# Patient Record
Sex: Male | Born: 1970 | Race: White | Hispanic: No | Smoking: Current every day smoker
Health system: Southern US, Community
[De-identification: ages and names within clinical notes are randomized; demographics above are authoritative.]

## PROBLEM LIST (undated history)

## (undated) DIAGNOSIS — F419 Anxiety disorder, unspecified: Secondary | ICD-10-CM

## (undated) DIAGNOSIS — F329 Major depressive disorder, single episode, unspecified: Secondary | ICD-10-CM

## (undated) DIAGNOSIS — K219 Gastro-esophageal reflux disease without esophagitis: Secondary | ICD-10-CM

## (undated) DIAGNOSIS — J449 Chronic obstructive pulmonary disease, unspecified: Secondary | ICD-10-CM

## (undated) DIAGNOSIS — M069 Rheumatoid arthritis, unspecified: Secondary | ICD-10-CM

## (undated) DIAGNOSIS — F32A Depression, unspecified: Secondary | ICD-10-CM

## (undated) DIAGNOSIS — M359 Systemic involvement of connective tissue, unspecified: Secondary | ICD-10-CM

## (undated) HISTORY — DX: Chronic obstructive pulmonary disease, unspecified: J44.9

## (undated) HISTORY — DX: Depression, unspecified: F32.A

## (undated) HISTORY — PX: BACK SURGERY: SHX140

## (undated) HISTORY — DX: Anxiety disorder, unspecified: F41.9

## (undated) HISTORY — DX: Gastro-esophageal reflux disease without esophagitis: K21.9

---
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
## 2002-09-04 ENCOUNTER — Ambulatory Visit (HOSPITAL_COMMUNITY): Admission: RE | Admit: 2002-09-04 | Discharge: 2002-09-04 | Payer: Self-pay | Admitting: Pulmonary Disease

## 2002-09-29 ENCOUNTER — Encounter: Payer: Self-pay | Admitting: Orthopedic Surgery

## 2002-10-27 ENCOUNTER — Encounter: Payer: Self-pay | Admitting: Orthopedic Surgery

## 2002-10-27 ENCOUNTER — Ambulatory Visit (HOSPITAL_COMMUNITY): Admission: RE | Admit: 2002-10-27 | Discharge: 2002-10-27 | Payer: Self-pay | Admitting: Orthopedic Surgery

## 2004-06-04 ENCOUNTER — Emergency Department (HOSPITAL_COMMUNITY): Admission: EM | Admit: 2004-06-04 | Discharge: 2004-06-04 | Payer: Self-pay | Admitting: Emergency Medicine

## 2004-11-27 ENCOUNTER — Ambulatory Visit: Payer: Self-pay | Admitting: Orthopedic Surgery

## 2004-12-08 ENCOUNTER — Emergency Department (HOSPITAL_COMMUNITY): Admission: EM | Admit: 2004-12-08 | Discharge: 2004-12-08 | Payer: Self-pay | Admitting: *Deleted

## 2004-12-22 ENCOUNTER — Ambulatory Visit (HOSPITAL_COMMUNITY): Admission: RE | Admit: 2004-12-22 | Discharge: 2004-12-22 | Payer: Self-pay | Admitting: Orthopedic Surgery

## 2004-12-28 ENCOUNTER — Ambulatory Visit: Payer: Self-pay | Admitting: Orthopedic Surgery

## 2005-01-12 ENCOUNTER — Ambulatory Visit (HOSPITAL_COMMUNITY): Admission: RE | Admit: 2005-01-12 | Discharge: 2005-01-12 | Payer: Self-pay | Admitting: Urology

## 2008-02-25 ENCOUNTER — Encounter (HOSPITAL_COMMUNITY): Admission: RE | Admit: 2008-02-25 | Discharge: 2008-03-26 | Payer: Self-pay | Admitting: Neurosurgery

## 2008-11-04 ENCOUNTER — Emergency Department (HOSPITAL_COMMUNITY): Admission: EM | Admit: 2008-11-04 | Discharge: 2008-11-04 | Payer: Self-pay | Admitting: Emergency Medicine

## 2009-02-12 ENCOUNTER — Emergency Department (HOSPITAL_COMMUNITY): Admission: EM | Admit: 2009-02-12 | Discharge: 2009-02-12 | Payer: Self-pay | Admitting: Emergency Medicine

## 2009-04-26 ENCOUNTER — Emergency Department (HOSPITAL_COMMUNITY): Admission: EM | Admit: 2009-04-26 | Discharge: 2009-04-26 | Payer: Self-pay | Admitting: Emergency Medicine

## 2009-10-22 ENCOUNTER — Emergency Department (HOSPITAL_COMMUNITY): Admission: EM | Admit: 2009-10-22 | Discharge: 2009-10-22 | Payer: Self-pay | Admitting: Emergency Medicine

## 2009-12-20 ENCOUNTER — Encounter: Payer: Self-pay | Admitting: Orthopedic Surgery

## 2009-12-20 ENCOUNTER — Ambulatory Visit (HOSPITAL_COMMUNITY)
Admission: RE | Admit: 2009-12-20 | Discharge: 2009-12-20 | Payer: Self-pay | Source: Home / Self Care | Admitting: Pulmonary Disease

## 2010-01-24 ENCOUNTER — Encounter: Payer: Self-pay | Admitting: Orthopedic Surgery

## 2010-01-24 ENCOUNTER — Ambulatory Visit
Admission: RE | Admit: 2010-01-24 | Discharge: 2010-01-24 | Payer: Self-pay | Source: Home / Self Care | Attending: Orthopedic Surgery | Admitting: Orthopedic Surgery

## 2010-01-24 DIAGNOSIS — M7520 Bicipital tendinitis, unspecified shoulder: Secondary | ICD-10-CM | POA: Insufficient documentation

## 2010-01-25 ENCOUNTER — Encounter: Payer: Self-pay | Admitting: Orthopedic Surgery

## 2010-02-16 NOTE — Progress Notes (Signed)
Summary: Gary Woods   Imported By: Jacklynn Ganong 01/23/2010 09:04:55  _____________________________________________________________________  External Attachment:    Type:   Image     Comment:   External Document

## 2010-02-16 NOTE — Letter (Signed)
Summary: Referral note from Dr. Juanetta Gosling  Referral note from Dr. Juanetta Gosling   Imported By: Jacklynn Ganong 01/26/2010 08:45:07  _____________________________________________________________________  External Attachment:    Type:   Image     Comment:   External Document
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
## 2010-02-16 NOTE — Progress Notes (Signed)
Summary: Initial evaluation  Initial evaluation   Imported By: Jacklynn Ganong 01/23/2010 09:04:05  _____________________________________________________________________  External Attachment:    Type:   Image     Comment:   External Document
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
## 2010-02-16 NOTE — Letter (Signed)
Summary: History form  History form   Imported By: Jacklynn Ganong 01/26/2010 08:43:02  _____________________________________________________________________  External Attachment:    Type:   Image     Comment:   External Document

## 2010-04-05 LAB — CBC
RBC: 4.93 MIL/uL (ref 4.22–5.81)
WBC: 9.4 10*3/uL (ref 4.0–10.5)

## 2010-04-05 LAB — BASIC METABOLIC PANEL
Calcium: 9.3 mg/dL (ref 8.4–10.5)
Chloride: 107 mEq/L (ref 96–112)
Creatinine, Ser: 0.95 mg/dL (ref 0.4–1.5)

## 2010-04-05 LAB — POCT CARDIAC MARKERS
CKMB, poc: <1 ng/mL — ABNORMAL LOW (ref 1.0–8.0)
Myoglobin, poc: 56.6 ng/mL (ref 12–200)
Myoglobin, poc: 56.9 ng/mL (ref 12–200)
Troponin i, poc: <0.05 ng/mL (ref 0.00–0.09)
Troponin i, poc: <0.05 ng/mL (ref 0.00–0.09)

## 2010-04-05 LAB — DIFFERENTIAL
Basophils Absolute: 0.1 10*3/uL (ref 0.0–0.1)
Basophils Relative: 1 % (ref 0–1)
Eosinophils Relative: 1 % (ref 0–5)
Lymphocytes Relative: 31 % (ref 12–46)
Lymphs Abs: 3 10*3/uL (ref 0.7–4.0)
Monocytes Relative: 6 % (ref 3–12)
Neutro Abs: 5.8 10*3/uL (ref 1.7–7.7)
Neutrophils Relative %: 61 % (ref 43–77)

## 2010-11-02 ENCOUNTER — Ambulatory Visit (INDEPENDENT_AMBULATORY_CARE_PROVIDER_SITE_OTHER): Payer: Medicaid Other | Admitting: Otolaryngology

## 2010-11-02 DIAGNOSIS — H905 Unspecified sensorineural hearing loss: Secondary | ICD-10-CM

## 2011-04-09 IMAGING — CR DG HIP (WITH OR WITHOUT PELVIS) 2-3V*L*
3 series · 3 of 3 positions shown · non-contrast
Comparison: None.

CLINICAL DATA: 39-year-old male with hip pain.  No known injury.

LEFT HIP - COMPLETE 2+ VIEW

[view not recorded (1 of 3)]
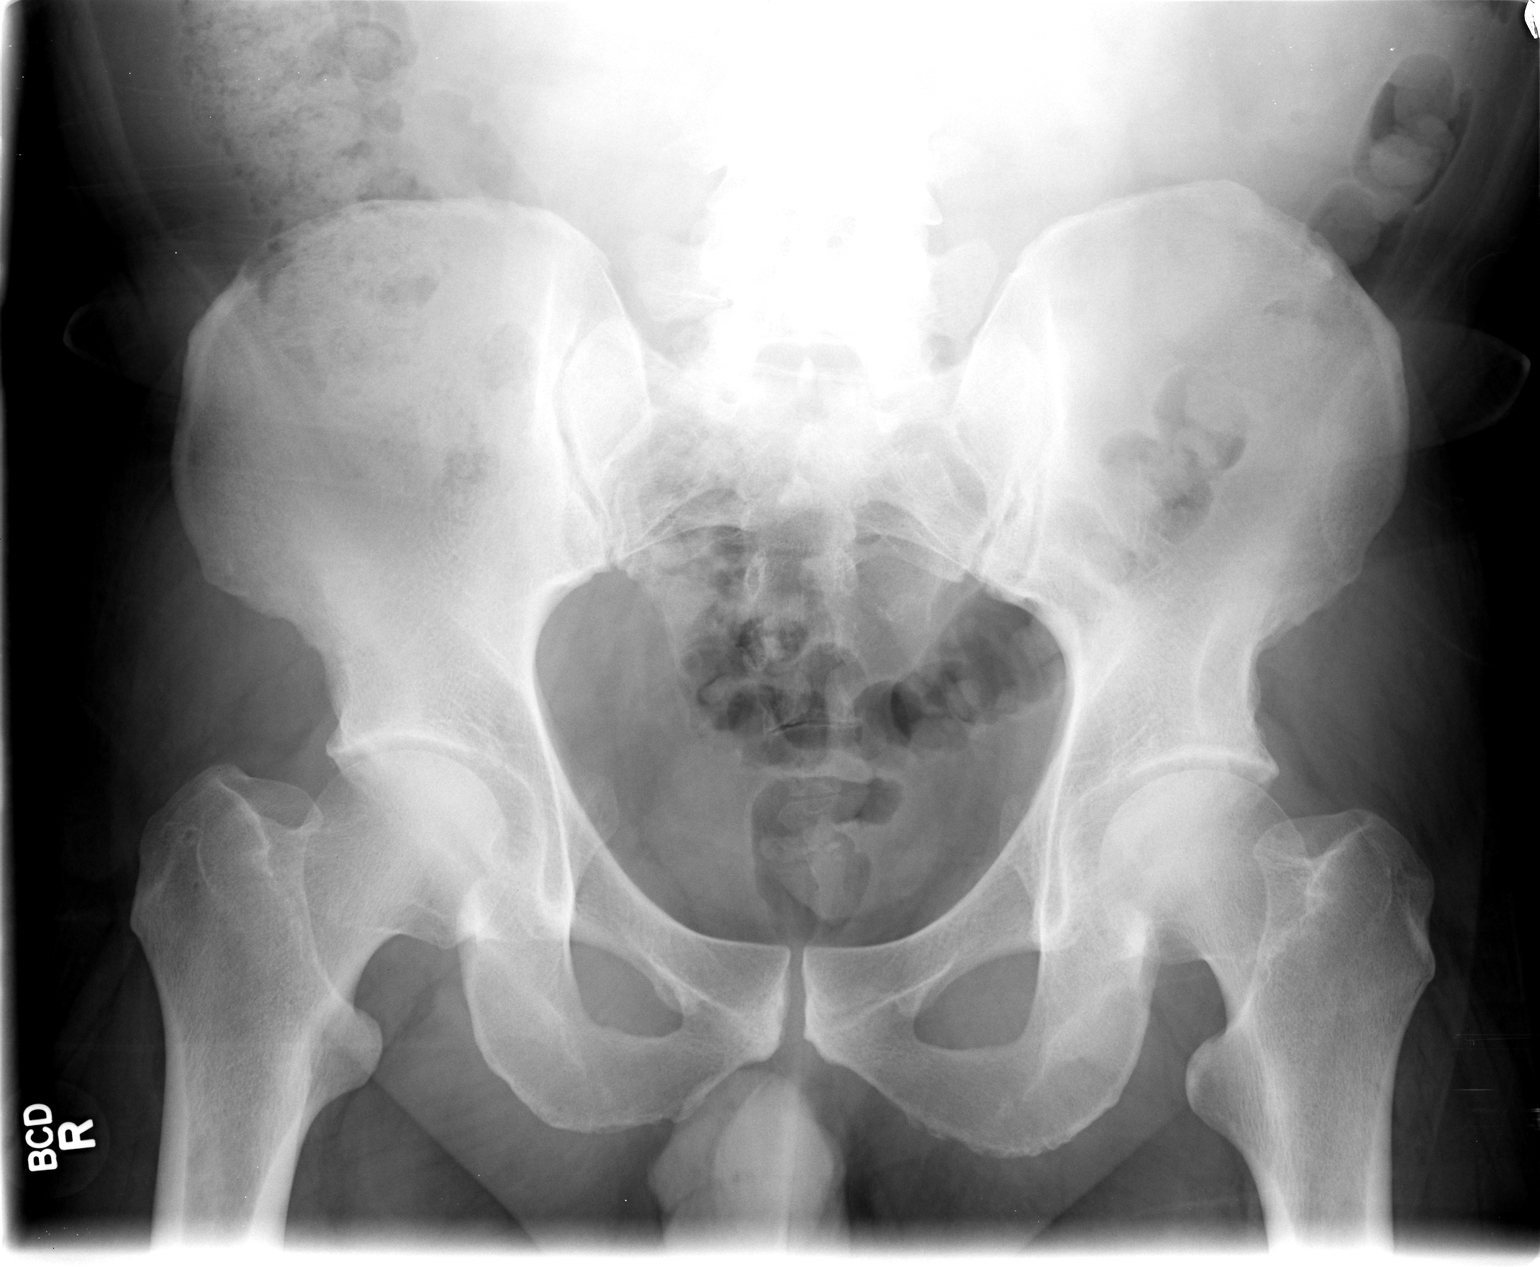

[view not recorded (2 of 3)]
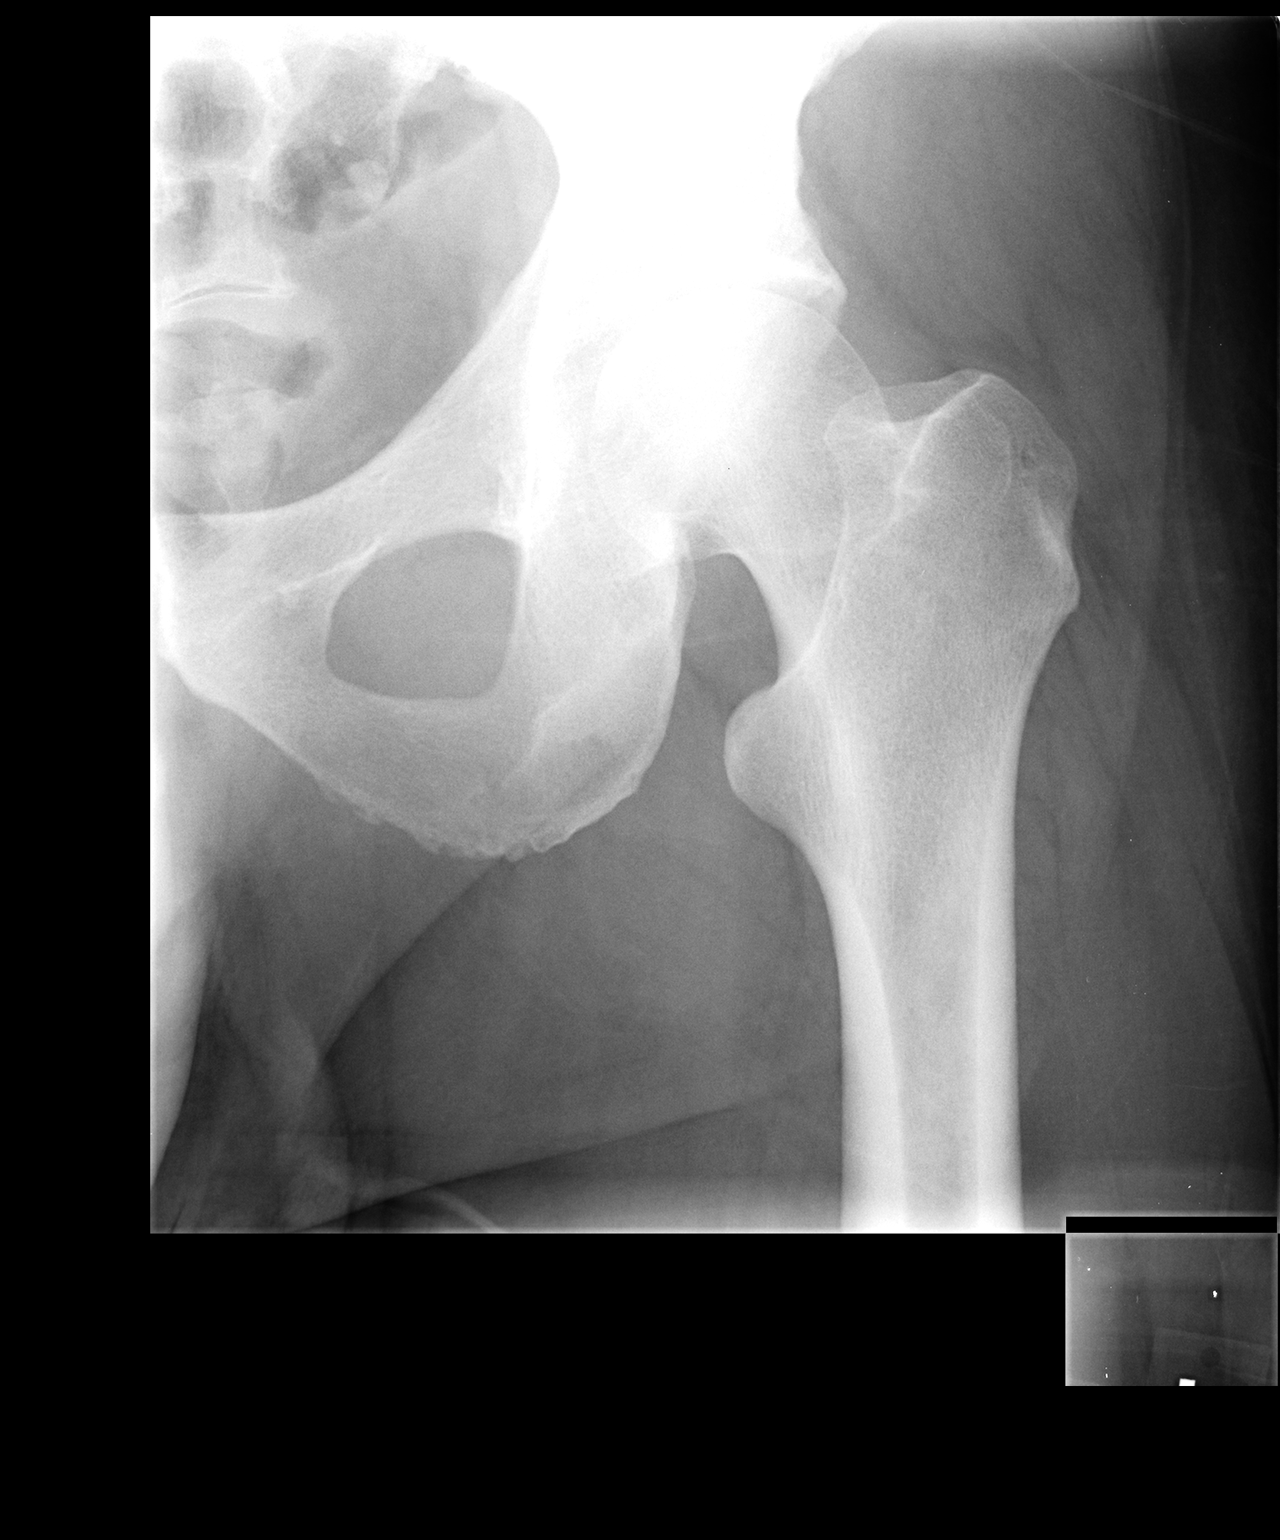

[view not recorded (3 of 3)]
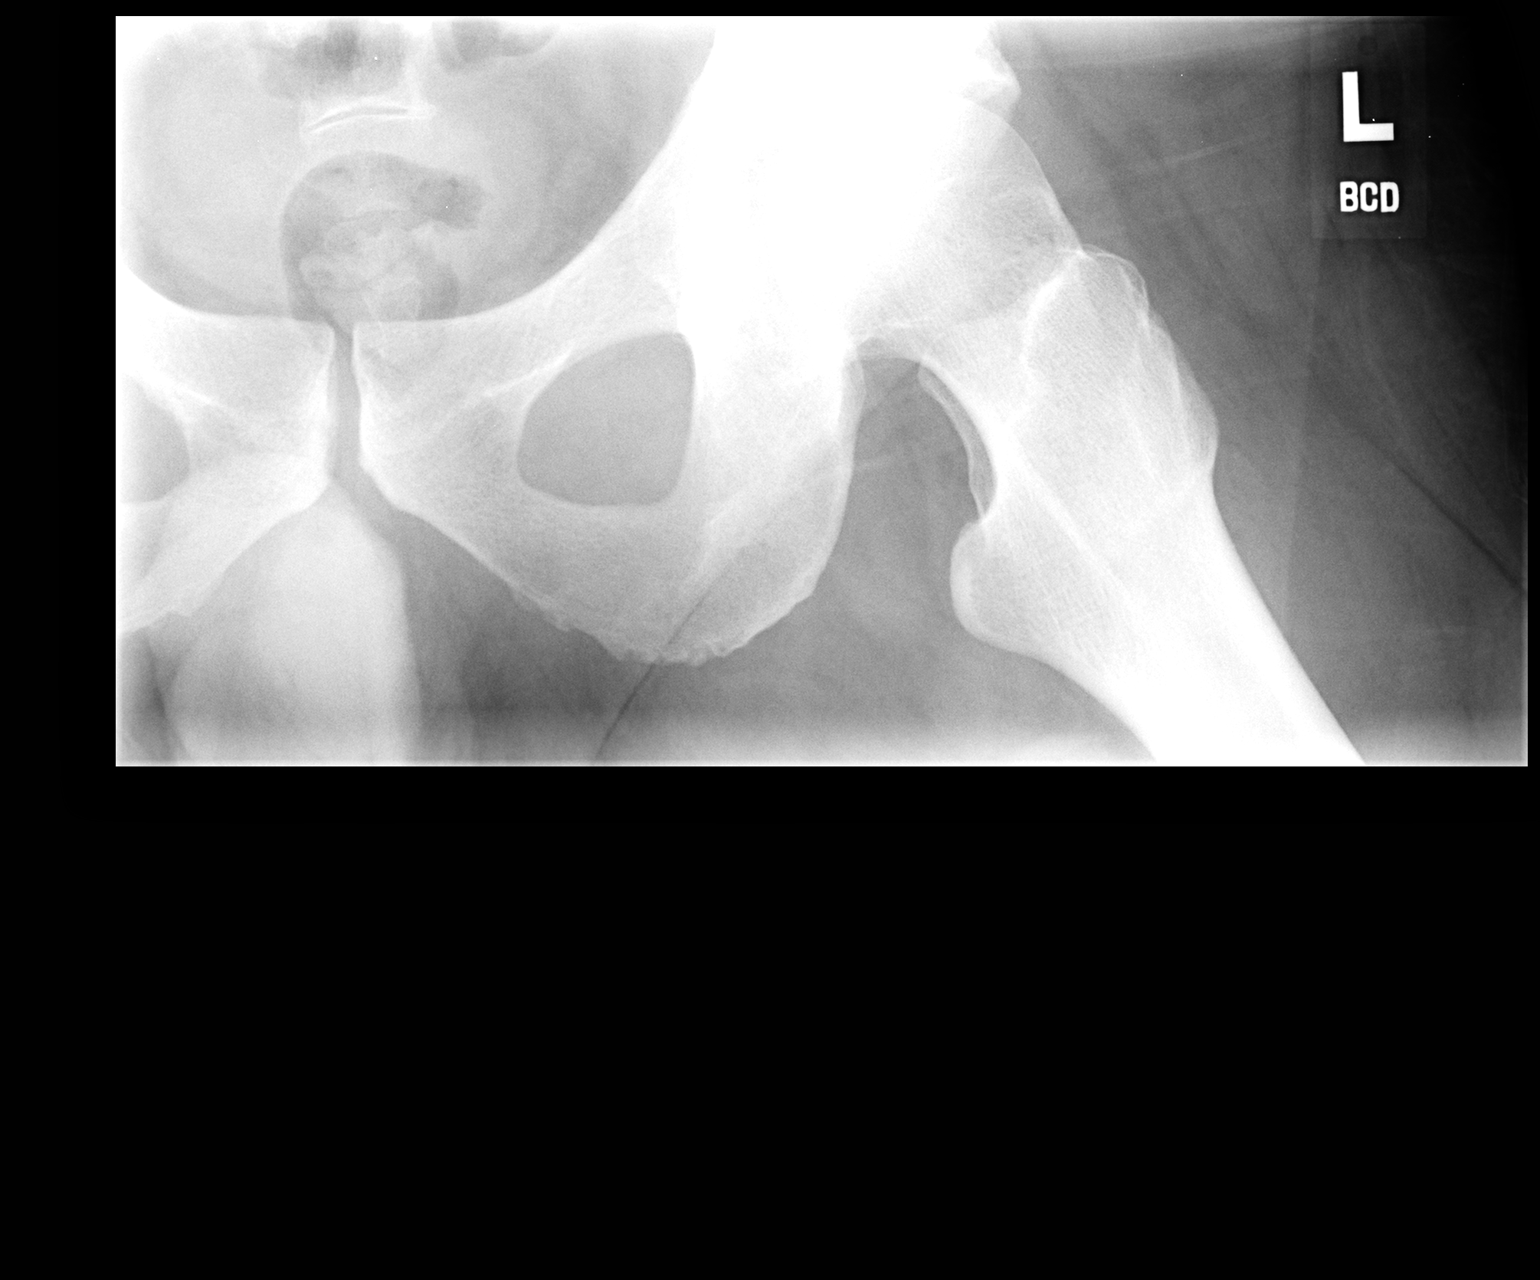

[3 of 3 positions shown; findings below may reference images not displayed]

FINDINGS: Bone mineralization is within normal limits.  Left
femoral head normally located.  Normal hip joint space.  Visualized
proximal femur intact.  Sacrum and SI joints within normal limits.
Pelvis intact.  Unremarkable visualized lower spine.
IMPRESSION: No acute osseous abnormality identified about the left hip and
pelvis.

## 2011-04-09 IMAGING — CR DG CERVICAL SPINE COMPLETE 4+V
5 series · 5 of 5 positions shown · non-contrast
Comparison: None.

CLINICAL DATA: 39-year-old male with neck and shoulder pain.  No
known injury.

CERVICAL SPINE - COMPLETE 4+ VIEW

[view not recorded (1 of 5)]
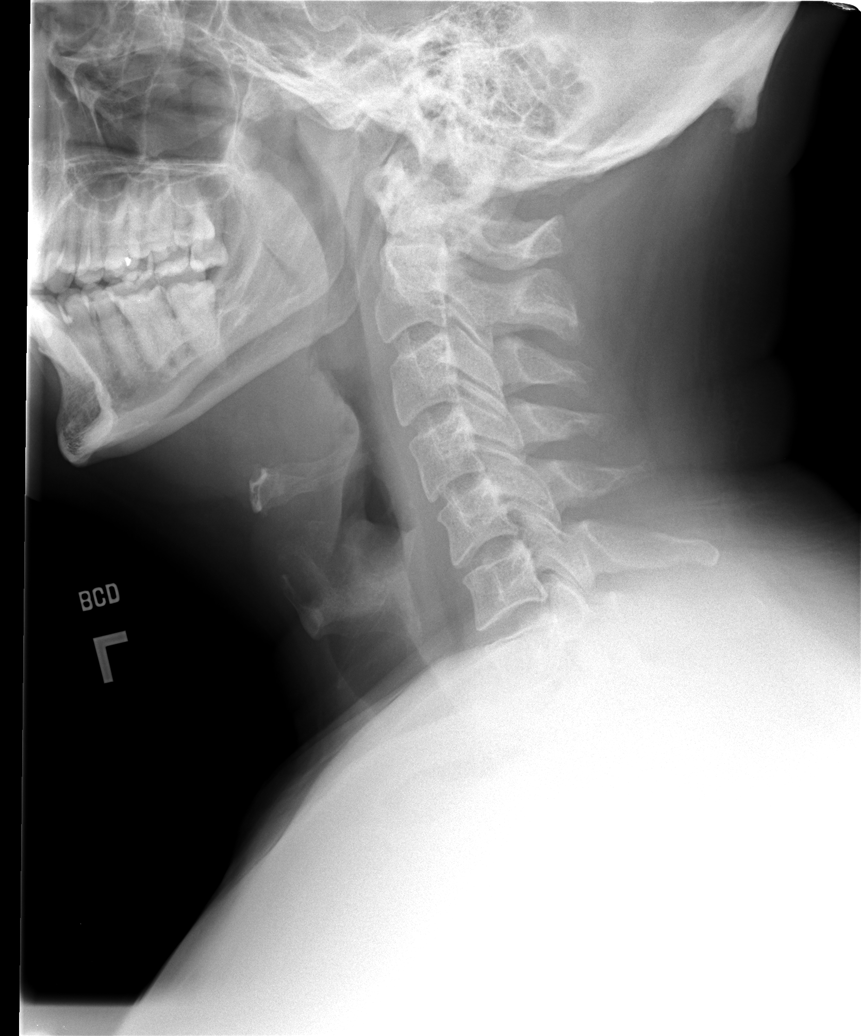

[view not recorded (2 of 5)]
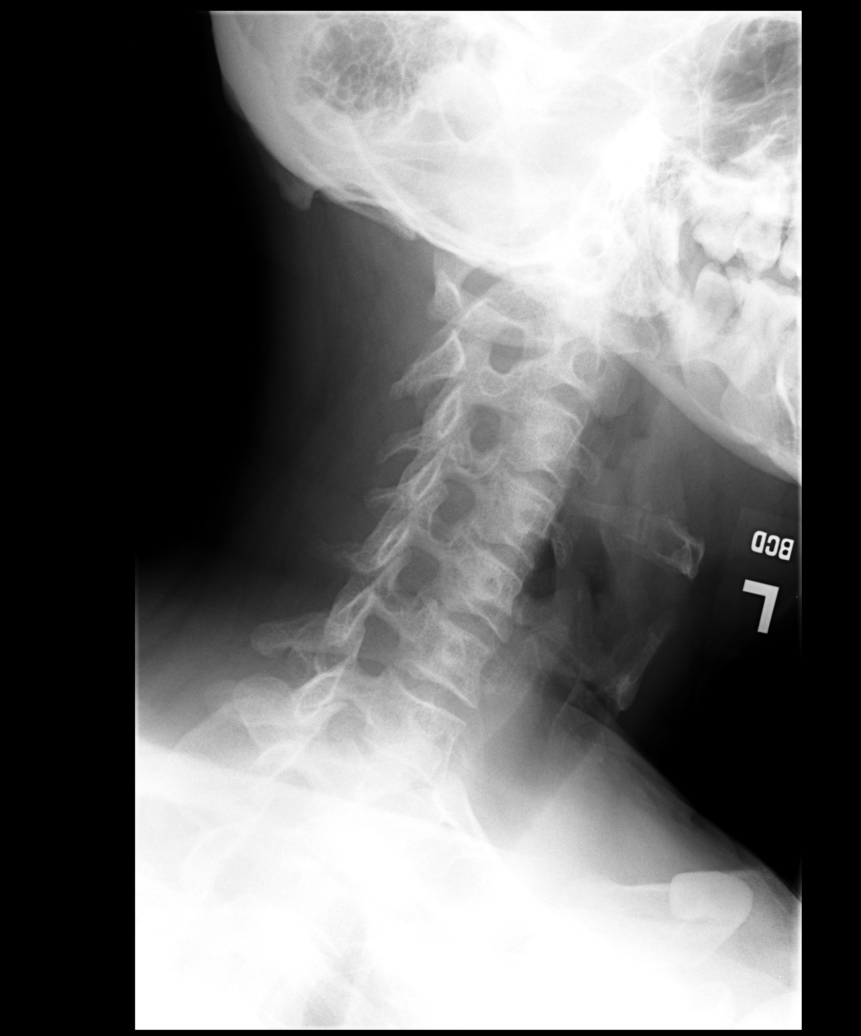

[view not recorded (3 of 5)]
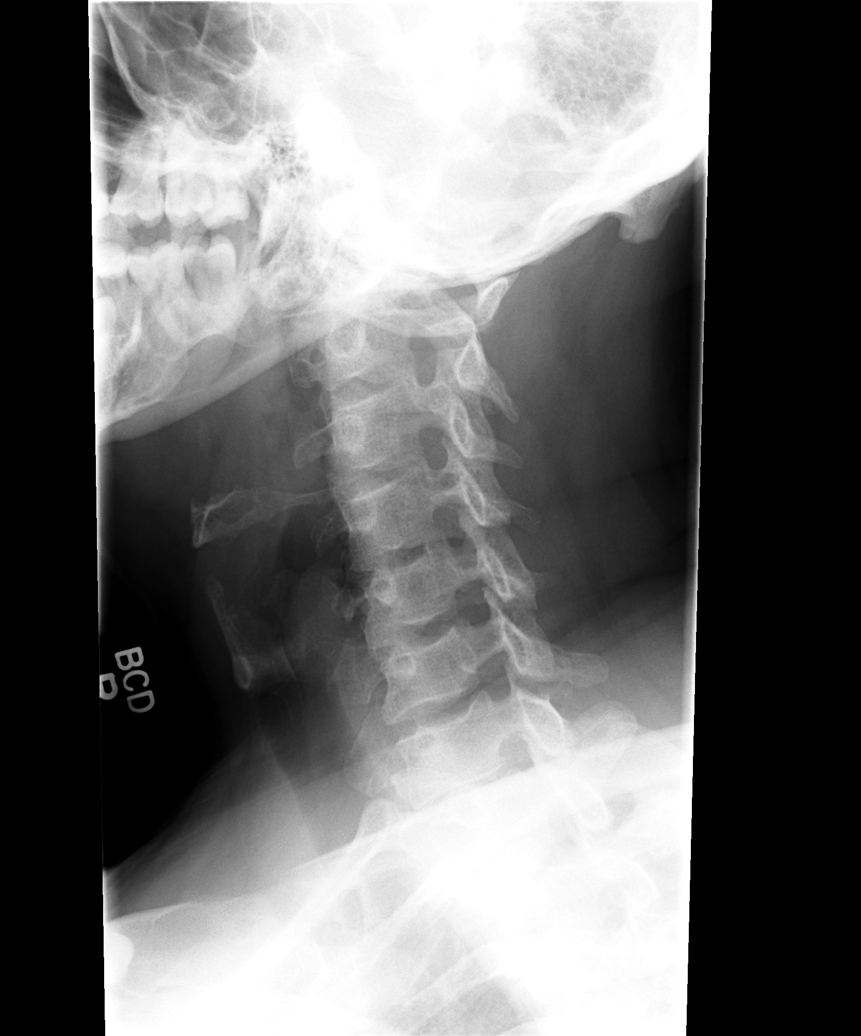

[view not recorded (4 of 5)]
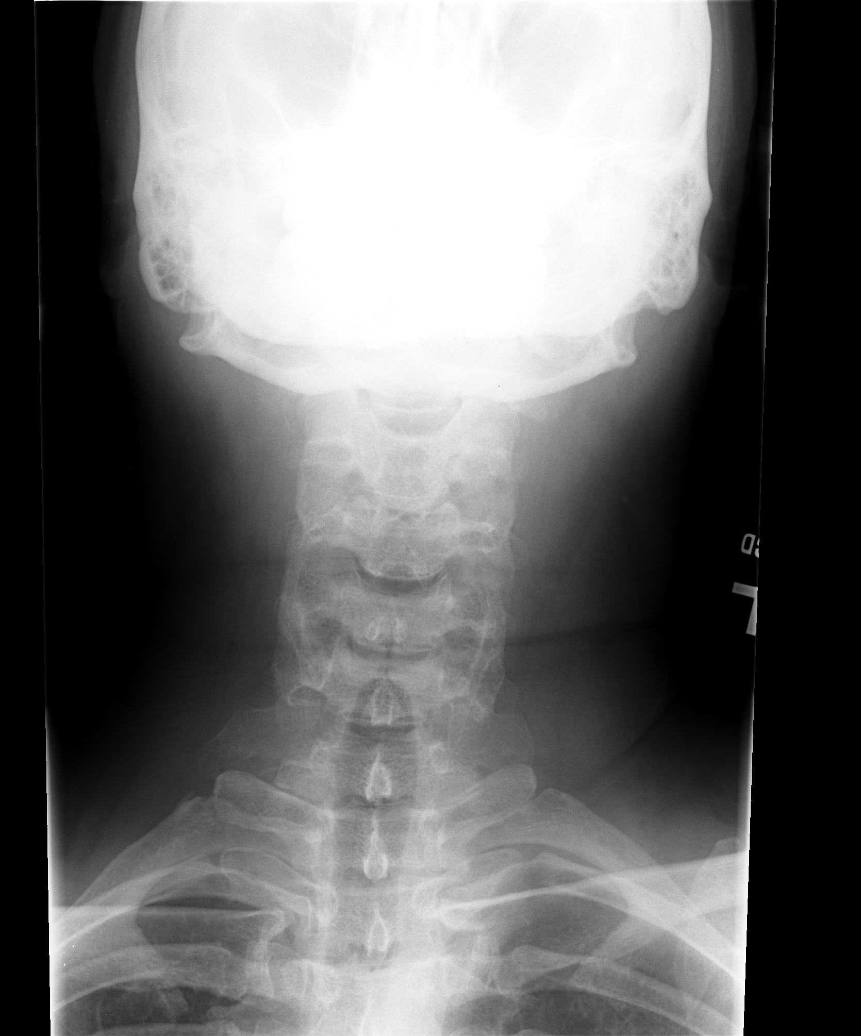

[view not recorded (5 of 5)]
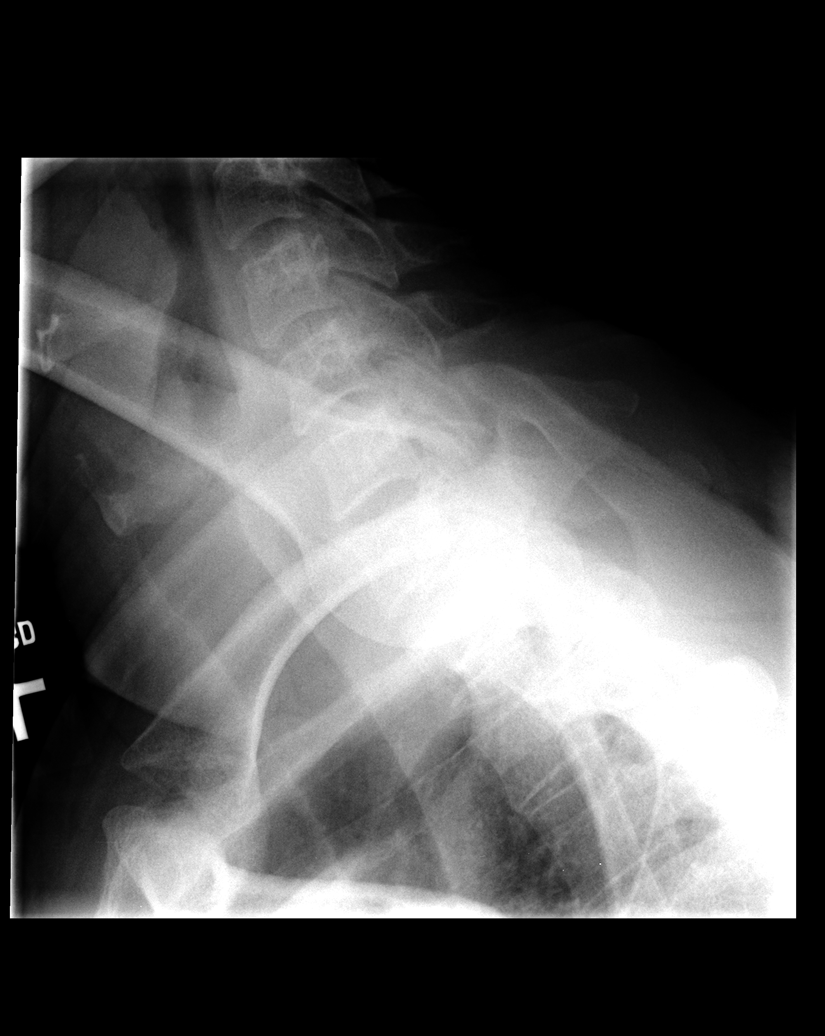

[5 of 5 positions shown; findings below may reference images not displayed]

FINDINGS: Straightening of cervical lordosis.  Prevertebral soft
tissues are within normal limits.  The relatively preserved disc
spaces. Bilateral posterior element alignment is within normal
limits.  AP alignment and lung apices within normal limits.  C1-C2
alignment and odontoid within normal limits. Cervicothoracic
junction alignment is within normal limits.
IMPRESSION: No acute osseous abnormality in the cervical spine.

## 2011-06-21 ENCOUNTER — Ambulatory Visit (INDEPENDENT_AMBULATORY_CARE_PROVIDER_SITE_OTHER): Payer: Medicaid Other | Admitting: Otolaryngology

## 2011-06-21 DIAGNOSIS — H6980 Other specified disorders of Eustachian tube, unspecified ear: Secondary | ICD-10-CM

## 2011-06-21 DIAGNOSIS — H905 Unspecified sensorineural hearing loss: Secondary | ICD-10-CM

## 2011-10-04 ENCOUNTER — Ambulatory Visit (INDEPENDENT_AMBULATORY_CARE_PROVIDER_SITE_OTHER): Payer: Medicaid Other | Admitting: Otolaryngology

## 2011-10-04 DIAGNOSIS — H6990 Unspecified Eustachian tube disorder, unspecified ear: Secondary | ICD-10-CM

## 2011-10-04 DIAGNOSIS — H6980 Other specified disorders of Eustachian tube, unspecified ear: Secondary | ICD-10-CM

## 2011-10-04 DIAGNOSIS — J310 Chronic rhinitis: Secondary | ICD-10-CM

## 2012-05-30 ENCOUNTER — Emergency Department (HOSPITAL_COMMUNITY): Payer: Medicaid Other

## 2012-05-30 ENCOUNTER — Encounter (HOSPITAL_COMMUNITY): Payer: Self-pay | Admitting: Emergency Medicine

## 2012-05-30 ENCOUNTER — Emergency Department (HOSPITAL_COMMUNITY)
Admission: EM | Admit: 2012-05-30 | Discharge: 2012-05-30 | Disposition: A | Discharge status: Home / Self Care | Payer: Medicaid Other | Attending: Emergency Medicine | Admitting: Emergency Medicine

## 2012-05-30 DIAGNOSIS — R42 Dizziness and giddiness: Secondary | ICD-10-CM | POA: Insufficient documentation

## 2012-05-30 DIAGNOSIS — Z79899 Other long term (current) drug therapy: Secondary | ICD-10-CM | POA: Insufficient documentation

## 2012-05-30 DIAGNOSIS — R109 Unspecified abdominal pain: Secondary | ICD-10-CM | POA: Insufficient documentation

## 2012-05-30 DIAGNOSIS — J40 Bronchitis, not specified as acute or chronic: Secondary | ICD-10-CM

## 2012-05-30 DIAGNOSIS — J3489 Other specified disorders of nose and nasal sinuses: Secondary | ICD-10-CM | POA: Insufficient documentation

## 2012-05-30 DIAGNOSIS — Z8739 Personal history of other diseases of the musculoskeletal system and connective tissue: Secondary | ICD-10-CM | POA: Insufficient documentation

## 2012-05-30 DIAGNOSIS — R0602 Shortness of breath: Secondary | ICD-10-CM | POA: Insufficient documentation

## 2012-05-30 DIAGNOSIS — J209 Acute bronchitis, unspecified: Secondary | ICD-10-CM | POA: Insufficient documentation

## 2012-05-30 DIAGNOSIS — F17200 Nicotine dependence, unspecified, uncomplicated: Secondary | ICD-10-CM | POA: Insufficient documentation

## 2012-05-30 LAB — CBC WITH DIFFERENTIAL/PLATELET
Basophils Absolute: 0 10*3/uL (ref 0.0–0.1)
Basophils Relative: 0 % (ref 0–1)
Eosinophils Absolute: 0 10*3/uL (ref 0.0–0.7)
HCT: 47.3 % (ref 39.0–52.0)
Lymphocytes Relative: 41 % (ref 12–46)
Lymphs Abs: 2.2 10*3/uL (ref 0.7–4.0)
Monocytes Absolute: 0.6 10*3/uL (ref 0.1–1.0)
Monocytes Relative: 10 % (ref 3–12)
Neutro Abs: 2.6 10*3/uL (ref 1.7–7.7)
RBC: 4.97 MIL/uL (ref 4.22–5.81)
RDW: 13 % (ref 11.5–15.5)

## 2012-05-30 LAB — BASIC METABOLIC PANEL
Calcium: 9.1 mg/dL (ref 8.4–10.5)
GFR calc Af Amer: >90 mL/min (ref >90)
Glucose, Bld: 89 mg/dL (ref 70–99)

## 2012-05-30 LAB — TROPONIN I: Troponin I: <0.3 ng/mL (ref <0.30)

## 2012-05-30 MED ORDER — LEVOFLOXACIN 750 MG PO TABS: 750.0000 mg | ORAL_TABLET | Freq: Every day | ORAL | Status: DC

## 2012-05-30 MED ORDER — AEROCHAMBER PLUS W/MASK MISC: Status: DC

## 2012-05-30 MED ORDER — ALBUTEROL SULFATE HFA 108 (90 BASE) MCG/ACT IN AERS: 2.0000 | INHALATION_SPRAY | RESPIRATORY_TRACT | Status: DC | PRN

## 2012-05-30 NOTE — ED Notes (Signed)
Patient with no complaints at this time. Respirations even and unlabored. Skin warm/dry. Discharge instructions reviewed with patient at this time. Patient given opportunity to voice concerns/ask questions. Patient discharged at this time and left Emergency Department with steady gait.   

## 2012-05-30 NOTE — ED Notes (Signed)
Pt seen pcp x 4 days ago and dx with URI.has been taking rx cough/antibiotics meds.  Went to dr Juanetta Gosling today due to chest tightness and no better.  Was sent here. Pt arrived alert/oriented. Face red. cbg 97 en route. Pt c/o right sided chest pressure that does not radiate. Denies sob. C/o some dizziness. States he is having stress at home. Nondiaphoretic.

## 2012-05-30 NOTE — ED Provider Notes (Signed)
History  This chart was scribed for Flint Melter, MD by Bennett Scrape, ED Scribe. This patient was seen in room APA05/APA05 and the patient's care was started at 2:00 PM.  CSN: [id number]  Arrival date & time 05/30/12  1245   First MD Initiated Contact with Patient 05/30/12 1400      Chief Complaint  Patient presents with  . Cough     The history is provided by the patient. No language interpreter was used.    HPI Comments: Gary Woods is a 42 y.o. male who presents to the Emergency Department complaining of one week of gradual onset, gradually worsening, constant non-productive cough with associated bilateral abdominal pain with coughing only. He also reports SOB during coughing spells. He has been seen by his PCP 4 days ago for the same and was given an antibiotic (keflex) and tessalon with mild improvement. He denies having prior episodes of similar symptoms. He denies acute SOB, fever and decreased appetite as associated symptoms. He is not currently on any daily medications. Pt is a current everyday smoker but denies alcohol use.   Past Medical History  Diagnosis Date  . Arthritis     History reviewed. No pertinent past surgical history.  History reviewed. No pertinent family history.  History  Substance Use Topics  . Smoking status: Current Every Day Smoker  . Smokeless tobacco: Not on file  . Alcohol Use: No      Review of Systems  Constitutional: Negative for fever.  Respiratory: Positive for cough and shortness of breath (during coughing spells). Negative for chest tightness.   Gastrointestinal: Positive for abdominal pain (with coughing). Negative for nausea and vomiting.  All other systems reviewed and are negative.    Allergies  Review of patient's allergies indicates no known allergies.  Home Medications   Current Outpatient Rx  Name  Route  Sig  Dispense  Refill  . benzonatate (TESSALON) 100 MG capsule   Oral   Take 100 mg by mouth 3  (three) times daily as needed for cough.         . cephALEXin (KEFLEX) 500 MG capsule   Oral   Take 500 mg by mouth 3 (three) times daily.         . methotrexate (RHEUMATREX) 2.5 MG tablet   Oral   Take 2.5 mg by mouth once a week. Caution:Chemotherapy. Protect from light. **normally takes on Monday or Tuesday**         . albuterol (PROVENTIL HFA;VENTOLIN HFA) 108 (90 BASE) MCG/ACT inhaler   Inhalation   Inhale 2 puffs into the lungs every 4 (four) hours as needed for wheezing.   6.7 g   0   . levofloxacin (LEVAQUIN) 750 MG tablet   Oral   Take 1 tablet (750 mg total) by mouth daily. X 7 days   7 tablet   0   . Spacer/Aero-Holding Chambers (AEROCHAMBER PLUS WITH MASK) inhaler      Use as instructed   1 each   2     Use with the inhaler     Triage Vitals: BP 123/69  Pulse 54  Temp(Src) 98.6 F (37 C) (Oral)  Resp 20  SpO2 99%  Physical Exam  Nursing note and vitals reviewed. Constitutional: He is oriented to person, place, and time. He appears well-developed and well-nourished.  HENT:  Head: Normocephalic and atraumatic.  Right Ear: External ear normal.  Left Ear: External ear normal.  Eyes: Conjunctivae and EOM are normal.  Pupils are equal, round, and reactive to light.  Neck: Normal range of motion and phonation normal. Neck supple.  Cardiovascular: Normal rate, regular rhythm, normal heart sounds and intact distal pulses.   Pulmonary/Chest: Effort normal and breath sounds normal. He exhibits no bony tenderness.  Abdominal: Soft. Normal appearance. There is no tenderness.  Musculoskeletal: Normal range of motion.  Neurological: He is alert and oriented to person, place, and time. He has normal strength. No cranial nerve deficit or sensory deficit. He exhibits normal muscle tone. Coordination normal.  Skin: Skin is warm, dry and intact.  Psychiatric: He has a normal mood and affect. His behavior is normal. Judgment and thought content normal.    ED  Course  Procedures (including critical care time)  DIAGNOSTIC STUDIES: Oxygen Saturation is 99% on room air, normal by my interpretation.    COORDINATION OF CARE: 2:07 PM-Informed pt of negative CXR, CBC and normal EKG. Discussed treatment plan with pt at bedside and pt agreed to plan. Advised pt that he should complete the Keflex antibiotic course. Discussed smoking cessation.  2:19 PM-Informed pt of negative BMP and troponin. Discussed discharge plan which includes Levaquin and albuterol inhaler with pt and pt agreed to plan. Also advised pt to follow up as needed and pt agreed. Addressed symptoms to return for with pt.   Nursing Notes Reviewed/ Care Coordinated Applicable Imaging Reviewed Radiologic imaging report reviewed and images by radiology  - viewed, by me.  Interpretation of Laboratory Data incorporated into ED treatment  Labs Reviewed  TROPONIN I  CBC WITH DIFFERENTIAL  BASIC METABOLIC PANEL   Dg Chest Portable 1 View  05/30/2012   *RADIOLOGY REPORT*  Clinical Data: Nasal congestion, dizziness, chest pressure  CHEST - 1 VIEW  Comparison:  04/26/2009  Findings: The heart size and mediastinal contours are within normal limits.  Both lungs are clear.  IMPRESSION: No active disease.   Original Report Authenticated By: Judie Petit. Shick, M.D.     1. Bronchitis       MDM  Evaluation consistent with bronchitis. Doubt ACS, PE, or pneumonia.Doubt metabolic instability, serious bacterial infection or impending vascular collapse; the patient is stable for discharge.  Nursing Notes Reviewed/ Care Coordinated, and agree without changes. Applicable Imaging Reviewed.  Interpretation of Laboratory Data incorporated into ED treatment  Plan: Home Medications- Levaquin and albuterol inhaler; Home Treatments- use inhaler as needed every 4 hours and take Levaquin as prescribed; Recommended follow up- with PCP or return for worsening or concerning symptoms   I personally performed the services  described in this documentation, which was scribed in my presence. The recorded information has been reviewed and is accurate.      Flint Melter, MD 05/30/12 2137

## 2012-05-30 NOTE — ED Notes (Signed)
Pt denies si/hi. Pt anxious, comfort measures given. Pt states he is nervous. Per ems, pt was hyperventilating upon arrival.

## 2012-10-16 ENCOUNTER — Ambulatory Visit (INDEPENDENT_AMBULATORY_CARE_PROVIDER_SITE_OTHER): Payer: Medicaid Other | Admitting: Otolaryngology

## 2012-10-16 DIAGNOSIS — H6990 Unspecified Eustachian tube disorder, unspecified ear: Secondary | ICD-10-CM

## 2012-10-16 DIAGNOSIS — H6120 Impacted cerumen, unspecified ear: Secondary | ICD-10-CM

## 2012-10-16 DIAGNOSIS — H6980 Other specified disorders of Eustachian tube, unspecified ear: Secondary | ICD-10-CM

## 2013-01-14 ENCOUNTER — Ambulatory Visit (HOSPITAL_COMMUNITY)
Admission: RE | Admit: 2013-01-14 | Discharge: 2013-01-14 | Disposition: A | Discharge status: Home / Self Care | Payer: Medicaid Other | Source: Ambulatory Visit | Attending: Preventative Medicine | Admitting: Preventative Medicine

## 2013-01-14 DIAGNOSIS — T24239A Burn of second degree of unspecified lower leg, initial encounter: Secondary | ICD-10-CM | POA: Insufficient documentation

## 2013-01-14 DIAGNOSIS — IMO0001 Reserved for inherently not codable concepts without codable children: Secondary | ICD-10-CM | POA: Insufficient documentation

## 2013-01-14 DIAGNOSIS — T300 Burn of unspecified body region, unspecified degree: Secondary | ICD-10-CM
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
## 2013-01-16 ENCOUNTER — Ambulatory Visit (HOSPITAL_COMMUNITY)
Admission: RE | Admit: 2013-01-16 | Discharge: 2013-01-16 | Disposition: A | Discharge status: Home / Self Care | Payer: Medicaid Other | Source: Ambulatory Visit | Attending: Pulmonary Disease | Admitting: Pulmonary Disease

## 2013-01-16 DIAGNOSIS — T24239A Burn of second degree of unspecified lower leg, initial encounter: Secondary | ICD-10-CM | POA: Insufficient documentation

## 2013-01-16 DIAGNOSIS — T300 Burn of unspecified body region, unspecified degree: Secondary | ICD-10-CM

## 2013-01-16 DIAGNOSIS — IMO0001 Reserved for inherently not codable concepts without codable children: Secondary | ICD-10-CM | POA: Insufficient documentation
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
## 2013-01-19 ENCOUNTER — Ambulatory Visit (HOSPITAL_COMMUNITY)
Admission: RE | Admit: 2013-01-19 | Discharge: 2013-01-19 | Disposition: A | Discharge status: Home / Self Care | Payer: Medicaid Other | Source: Ambulatory Visit | Attending: Pulmonary Disease | Admitting: Pulmonary Disease

## 2013-01-19 DIAGNOSIS — T300 Burn of unspecified body region, unspecified degree: Secondary | ICD-10-CM
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
## 2013-01-20 ENCOUNTER — Ambulatory Visit (HOSPITAL_COMMUNITY): Payer: Medicaid Other | Admitting: Physical Therapy

## 2013-01-21 ENCOUNTER — Ambulatory Visit (HOSPITAL_COMMUNITY): Payer: Medicaid Other | Admitting: Physical Therapy

## 2013-01-22 ENCOUNTER — Ambulatory Visit (HOSPITAL_COMMUNITY)
Admission: RE | Admit: 2013-01-22 | Discharge: 2013-01-22 | Disposition: A | Discharge status: Home / Self Care | Payer: Medicaid Other | Source: Ambulatory Visit | Attending: Pulmonary Disease | Admitting: Pulmonary Disease

## 2013-01-22 DIAGNOSIS — T300 Burn of unspecified body region, unspecified degree: Secondary | ICD-10-CM
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
## 2013-01-23 ENCOUNTER — Ambulatory Visit (HOSPITAL_COMMUNITY): Payer: Medicaid Other

## 2013-01-26 ENCOUNTER — Ambulatory Visit (HOSPITAL_COMMUNITY)
Admission: RE | Admit: 2013-01-26 | Discharge: 2013-01-26 | Disposition: A | Discharge status: Home / Self Care | Payer: Medicaid Other | Source: Ambulatory Visit | Attending: Pulmonary Disease | Admitting: Pulmonary Disease

## 2013-01-27 ENCOUNTER — Ambulatory Visit (HOSPITAL_COMMUNITY): Payer: Medicaid Other

## 2013-01-29 ENCOUNTER — Ambulatory Visit (HOSPITAL_COMMUNITY)
Admission: RE | Admit: 2013-01-29 | Discharge: 2013-01-29 | Disposition: A | Discharge status: Home / Self Care | Payer: Medicaid Other | Source: Ambulatory Visit | Attending: Pulmonary Disease | Admitting: Pulmonary Disease

## 2013-01-29 DIAGNOSIS — T300 Burn of unspecified body region, unspecified degree: Secondary | ICD-10-CM
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
## 2013-02-02 ENCOUNTER — Ambulatory Visit (HOSPITAL_COMMUNITY): Payer: Medicaid Other | Admitting: Physical Therapy

## 2013-02-03 ENCOUNTER — Ambulatory Visit (HOSPITAL_COMMUNITY)
Admission: RE | Admit: 2013-02-03 | Discharge: 2013-02-03 | Disposition: A | Discharge status: Home / Self Care | Payer: Medicaid Other | Source: Ambulatory Visit | Attending: Pulmonary Disease | Admitting: Pulmonary Disease

## 2013-02-03 DIAGNOSIS — T300 Burn of unspecified body region, unspecified degree: Secondary | ICD-10-CM
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
## 2013-02-05 ENCOUNTER — Ambulatory Visit (HOSPITAL_COMMUNITY)
Admission: RE | Admit: 2013-02-05 | Discharge: 2013-02-05 | Disposition: A | Discharge status: Home / Self Care | Payer: Medicaid Other | Source: Ambulatory Visit | Attending: Pulmonary Disease | Admitting: Pulmonary Disease

## 2013-02-05 DIAGNOSIS — T300 Burn of unspecified body region, unspecified degree: Secondary | ICD-10-CM
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
## 2013-02-10 ENCOUNTER — Ambulatory Visit (HOSPITAL_COMMUNITY)
Admission: RE | Admit: 2013-02-10 | Discharge: 2013-02-10 | Disposition: A | Discharge status: Home / Self Care | Payer: Medicaid Other | Source: Ambulatory Visit | Attending: Pulmonary Disease | Admitting: Pulmonary Disease
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
## 2013-02-12 ENCOUNTER — Ambulatory Visit (HOSPITAL_COMMUNITY)
Admission: RE | Admit: 2013-02-12 | Discharge: 2013-02-12 | Disposition: A | Discharge status: Home / Self Care | Payer: Medicaid Other | Source: Ambulatory Visit | Attending: Pulmonary Disease | Admitting: Pulmonary Disease

## 2013-02-12 DIAGNOSIS — T300 Burn of unspecified body region, unspecified degree: Secondary | ICD-10-CM
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
## 2013-02-17 ENCOUNTER — Ambulatory Visit (HOSPITAL_COMMUNITY)
Admission: RE | Admit: 2013-02-17 | Discharge: 2013-02-17 | Disposition: A | Discharge status: Home / Self Care | Payer: Medicaid Other | Source: Ambulatory Visit | Attending: Pulmonary Disease | Admitting: Pulmonary Disease

## 2013-02-17 DIAGNOSIS — IMO0001 Reserved for inherently not codable concepts without codable children: Secondary | ICD-10-CM | POA: Insufficient documentation

## 2013-02-17 DIAGNOSIS — T300 Burn of unspecified body region, unspecified degree: Secondary | ICD-10-CM

## 2013-02-17 DIAGNOSIS — T24239A Burn of second degree of unspecified lower leg, initial encounter: Secondary | ICD-10-CM | POA: Insufficient documentation
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
## 2013-02-19 ENCOUNTER — Ambulatory Visit (HOSPITAL_COMMUNITY)
Admission: RE | Admit: 2013-02-19 | Discharge: 2013-02-19 | Disposition: A | Discharge status: Home / Self Care | Payer: Medicaid Other | Source: Ambulatory Visit | Attending: Pulmonary Disease | Admitting: Pulmonary Disease

## 2013-02-19 DIAGNOSIS — T300 Burn of unspecified body region, unspecified degree: Secondary | ICD-10-CM
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
## 2013-02-24 ENCOUNTER — Ambulatory Visit (HOSPITAL_COMMUNITY)
Admission: RE | Admit: 2013-02-24 | Discharge: 2013-02-24 | Disposition: A | Discharge status: Home / Self Care | Payer: Medicaid Other | Source: Ambulatory Visit | Attending: Pulmonary Disease | Admitting: Pulmonary Disease
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
## 2013-02-26 ENCOUNTER — Ambulatory Visit (HOSPITAL_COMMUNITY): Payer: Medicaid Other

## 2015-04-20 DIAGNOSIS — E162 Hypoglycemia, unspecified: Secondary | ICD-10-CM | POA: Diagnosis not present

## 2015-04-20 DIAGNOSIS — F410 Panic disorder [episodic paroxysmal anxiety] without agoraphobia: Secondary | ICD-10-CM | POA: Diagnosis not present

## 2015-04-20 DIAGNOSIS — M069 Rheumatoid arthritis, unspecified: Secondary | ICD-10-CM | POA: Diagnosis not present

## 2015-04-20 DIAGNOSIS — K210 Gastro-esophageal reflux disease with esophagitis: Secondary | ICD-10-CM | POA: Diagnosis not present

## 2015-05-20 ENCOUNTER — Encounter (HOSPITAL_COMMUNITY): Payer: Self-pay | Admitting: Emergency Medicine

## 2015-05-20 ENCOUNTER — Emergency Department (HOSPITAL_COMMUNITY)
Admission: EM | Admit: 2015-05-20 | Discharge: 2015-05-20 | Disposition: A | Discharge status: Home / Self Care | Payer: BLUE CROSS/BLUE SHIELD | Attending: Emergency Medicine | Admitting: Emergency Medicine

## 2015-05-20 DIAGNOSIS — M1990 Unspecified osteoarthritis, unspecified site: Secondary | ICD-10-CM | POA: Diagnosis not present

## 2015-05-20 DIAGNOSIS — K611 Rectal abscess: Secondary | ICD-10-CM | POA: Insufficient documentation

## 2015-05-20 DIAGNOSIS — F17200 Nicotine dependence, unspecified, uncomplicated: Secondary | ICD-10-CM | POA: Insufficient documentation

## 2015-05-20 DIAGNOSIS — K6289 Other specified diseases of anus and rectum: Secondary | ICD-10-CM | POA: Diagnosis not present

## 2015-05-20 LAB — BASIC METABOLIC PANEL
ANION GAP: 5 (ref 5–15)
BUN: 22 mg/dL — ABNORMAL HIGH (ref 6–20)
CHLORIDE: 109 mmol/L (ref 101–111)
CO2: 26 mmol/L (ref 22–32)
CREATININE: 0.85 mg/dL (ref 0.61–1.24)
Calcium: 8.8 mg/dL — ABNORMAL LOW (ref 8.9–10.3)
GFR calc non Af Amer: >60 mL/min (ref >60)
Glucose, Bld: 82 mg/dL (ref 65–99)
Potassium: 4.3 mmol/L (ref 3.5–5.1)
Sodium: 140 mmol/L (ref 135–145)

## 2015-05-20 LAB — CBC WITH DIFFERENTIAL/PLATELET
BASOS PCT: 0 %
Basophils Absolute: 0 10*3/uL (ref 0.0–0.1)
Eosinophils Absolute: 0.1 10*3/uL (ref 0.0–0.7)
Eosinophils Relative: 1 %
HEMATOCRIT: 45.2 % (ref 39.0–52.0)
HEMOGLOBIN: 15.5 g/dL (ref 13.0–17.0)
Lymphocytes Relative: 39 %
Lymphs Abs: 3.4 10*3/uL (ref 0.7–4.0)
MCH: 32.6 pg (ref 26.0–34.0)
MCHC: 34.3 g/dL (ref 30.0–36.0)
MCV: 95 fL (ref 78.0–100.0)
MONOS PCT: 9 %
Monocytes Absolute: 0.8 10*3/uL (ref 0.1–1.0)
NEUTROS ABS: 4.4 10*3/uL (ref 1.7–7.7)
NEUTROS PCT: 51 %
Platelets: 190 10*3/uL (ref 150–400)
RBC: 4.76 MIL/uL (ref 4.22–5.81)
RDW: 12.7 % (ref 11.5–15.5)
WBC: 8.7 10*3/uL (ref 4.0–10.5)

## 2015-05-20 MED ORDER — LIDOCAINE-EPINEPHRINE (PF) 2 %-1:200000 IJ SOLN
20.0000 mL | Freq: Once | INTRAMUSCULAR | Status: AC
  Administered 2015-05-20: 20 mL
  Filled 2015-05-20: qty 20

## 2015-05-20 MED ORDER — ONDANSETRON 8 MG PO TBDP
ORAL_TABLET | ORAL | Status: AC
  Filled 2015-05-20: qty 1

## 2015-05-20 MED ORDER — SULFAMETHOXAZOLE-TRIMETHOPRIM 800-160 MG PO TABS
1.0000 | ORAL_TABLET | Freq: Once | ORAL | Status: AC
  Administered 2015-05-20: 1 via ORAL
  Filled 2015-05-20: qty 1

## 2015-05-20 MED ORDER — HYDROMORPHONE HCL 1 MG/ML IJ SOLN
1.0000 mg | Freq: Once | INTRAMUSCULAR | Status: AC
  Administered 2015-05-20: 1 mg via INTRAVENOUS
  Filled 2015-05-20: qty 1

## 2015-05-20 MED ORDER — SULFAMETHOXAZOLE-TRIMETHOPRIM 800-160 MG PO TABS: 1.0000 | ORAL_TABLET | Freq: Two times a day (BID) | ORAL | Status: AC

## 2015-05-20 MED ORDER — OXYCODONE-ACETAMINOPHEN 5-325 MG PO TABS: 1.0000 | ORAL_TABLET | ORAL | Status: DC | PRN

## 2015-05-20 MED ORDER — SODIUM CHLORIDE 0.9 % IV BOLUS (SEPSIS)
500.0000 mL | Freq: Once | INTRAVENOUS | Status: AC
  Administered 2015-05-20: 500 mL via INTRAVENOUS

## 2015-05-20 MED ORDER — ONDANSETRON 8 MG PO TBDP
8.0000 mg | ORAL_TABLET | Freq: Once | ORAL | Status: AC
  Administered 2015-05-20: 8 mg via ORAL

## 2015-05-20 MED ORDER — ONDANSETRON HCL 4 MG/2ML IJ SOLN
4.0000 mg | Freq: Once | INTRAMUSCULAR | Status: AC
  Administered 2015-05-20: 4 mg via INTRAVENOUS
  Filled 2015-05-20: qty 2

## 2015-05-20 NOTE — Discharge Instructions (Signed)
Perirectal Abscess An abscess is an infected area that contains a collection of pus. A perirectal abscess is an abscess that is near the opening of the anus or around the rectum. A perirectal abscess can cause a lot of pain, especially during bowel movements. CAUSES This condition is almost always caused by an infection that starts in an anal gland. RISK FACTORS This condition is more likely to develop in:  People with diabetes or inflammatory bowel disease.  People whose body defense system (immune system) is weak.  People who have anal sex.  People who have a sexually transmitted disease (STD).  People who have certain kinds of cancers, such as rectal carcinoma, leukemia, or lymphoma. SYMPTOMS The main symptom of this condition is pain. The pain may be a throbbing pain that gets worse during bowel movements. Other symptoms include:  Fever.  Swelling.  Redness.  Bleeding.  Constipation. DIAGNOSIS The condition is diagnosed with a physical exam. If the abscess is not visible, a health care provider may need to place a finger inside the rectum to find the abscess. Sometimes, imaging tests are done to determine the size and location of the abscess. These tests may include:  An ultrasound.  An MRI.  A CT scan. TREATMENT This condition is usually treated with incision and drainage surgery. Incision and drainage surgery involves making an incision over the abscess to drain the pus. Treatment may also involve antibiotic medicine, pain medicine, stool softeners, or laxatives. HOME CARE INSTRUCTIONS  Take medicines only as directed by your health care provider.  If you were prescribed an antibiotic, finish all of it even if you start to feel better.  To relieve pain, try sitting:  In a warm, shallow bath (sitz bath).  On a heating pad with the setting on low.  On an inflatable donut-shaped cushion.  Follow any diet instructions as directed by your health care  provider.  Keep all follow-up visits as directed by your health care provider. This is important. SEEK MEDICAL CARE IF:  Your abscess is bleeding.  You have pain, swelling, or redness that is getting worse.  You are constipated.  You feel ill.  You have muscle aches or chills.  You have a fever.  Your symptoms return after the abscess has healed.   This information is not intended to replace advice given to you by your health care provider. Make sure you discuss any questions you have with your health care provider.   Document Released: 12/30/1999 Document Revised: 09/22/2014 Document Reviewed: 11/11/2013 Elsevier Interactive Patient Education 2016 [place] in hot tub 2 times a day. Good hygiene. Expect blood and foul smelling discharge from the wound. Prescription for antibiotic and pain medication. Return to force.

## 2015-05-20 NOTE — ED Notes (Signed)
Pt reports rectal pain x1 week. Pt reports was seen at pcp office and prescribed a cream for hemorrhoids. Pt reports cream irritates site and causes more pain. nad noted.

## 2015-05-20 NOTE — ED Provider Notes (Signed)
CSN: [id number]     Arrival date & time 05/20/15  1754 History   First MD Initiated Contact with Patient 05/20/15 1814     Chief Complaint  Patient presents with  . Rectal Pain     (Consider location/radiation/quality/duration/timing/severity/associated sxs/prior Treatment) HPI.Marland KitchenMarland KitchenMarland KitchenPatient presents with right-sided buttock pain for approximately 1 week. Pain is increasing in severity. He is unable to sit. He called his primary care doctor and was sent to the emergency department. No fever or chills. No history of perirectal abscess. Severity of symptoms is moderate to severe.  Past Medical History  Diagnosis Date  . Arthritis    History reviewed. No pertinent past surgical history. History reviewed. No pertinent family history. Social History  Substance Use Topics  . Smoking status: Current Every Day Smoker -- 1.00 packs/day  . Smokeless tobacco: None  . Alcohol Use: No    Review of Systems  All other systems reviewed and are negative.     Allergies  Review of patient's allergies indicates no known allergies.  Home Medications   Prior to Admission medications   Medication Sig Start Date End Date Taking? Authorizing Provider  albuterol (PROVENTIL HFA;VENTOLIN HFA) 108 (90 BASE) MCG/ACT inhaler Inhale 2 puffs into the lungs every 4 (four) hours as needed for wheezing. 05/30/12   Daleen Bo, MD  benzonatate (TESSALON) 100 MG capsule Take 100 mg by mouth 3 (three) times daily as needed for cough.    Historical Provider, MD  cephALEXin (KEFLEX) 500 MG capsule Take 500 mg by mouth 3 (three) times daily.    Historical Provider, MD  levofloxacin (LEVAQUIN) 750 MG tablet Take 1 tablet (750 mg total) by mouth daily. X 7 days 05/30/12   Daleen Bo, MD  methotrexate (RHEUMATREX) 2.5 MG tablet Take 2.5 mg by mouth once a week. Caution:Chemotherapy. Protect from light. **normally takes on Monday or Tuesday**    Historical Provider, MD  oxyCODONE-acetaminophen (PERCOCET/ROXICET) 5-325  MG tablet Take 1-2 tablets by mouth every 4 (four) hours as needed for severe pain. 05/20/15   Nat Christen, MD  Spacer/Aero-Holding Chambers (AEROCHAMBER PLUS WITH MASK) inhaler Use as instructed 05/30/12   Daleen Bo, MD  sulfamethoxazole-trimethoprim (BACTRIM DS,SEPTRA DS) 800-160 MG tablet Take 1 tablet by mouth 2 (two) times daily. 05/20/15 05/27/15  Nat Christen, MD   BP 119/72 mmHg  Pulse 63  Temp(Src) 98.3 F (36.8 C) (Oral)  Resp 18  Ht 5\' 11"  (1.803 m)  Wt 200 lb (90.719 kg)  BMI 27.91 kg/m2  SpO2 100% Physical Exam  Constitutional: He is oriented to person, place, and time. He appears well-developed and well-nourished.  HENT:  Head: Normocephalic and atraumatic.  Eyes: Conjunctivae and EOM are normal. Pupils are equal, round, and reactive to light.  Neck: Normal range of motion. Neck supple.  Cardiovascular: Normal rate and regular rhythm.   Pulmonary/Chest: Effort normal and breath sounds normal.  Abdominal: Soft. Bowel sounds are normal.  Genitourinary:  Rectal exam: 2.5 cm abscess on the medial left buttocks near the rectum.  Musculoskeletal: Normal range of motion.  Neurological: He is alert and oriented to person, place, and time.  Skin: Skin is warm and dry.  Psychiatric: He has a normal mood and affect. His behavior is normal.  Nursing note and vitals reviewed.   ED Course  .Marland KitchenIncision and Drainage Date/Time: 05/20/2015 8:00 PM Performed by: Nat Christen Authorized by: Nat Christen Consent: The procedure was performed in an emergent situation. Verbal consent obtained. Risks and benefits: risks, benefits and alternatives were  discussed Consent given by: patient Patient understanding: patient states understanding of the procedure being performed Comments: 2000:  Anesthesia given with 2% Xylocaine with epinephrine 1-200,0004 mL.  Small nick made on lateral aspect of abscess with #11 blade.  Copious pus extruded from the wound. Wound was gently massaged to maximize the pus  extraction   (including critical care time) Labs Review Labs Reviewed  BASIC METABOLIC PANEL - Abnormal; Notable for the following:    BUN 22 (*)    Calcium 8.8 (*)    All other components within normal limits  CBC WITH DIFFERENTIAL/PLATELET    Imaging Review No results found. I have personally reviewed and evaluated these images and lab results as part of my medical decision-making.   EKG Interpretation None      MDM   Final diagnoses:  Perirectal abscess    Successful incision and drainage of left perirectal abscess. Discharge medications Septra DS and Percocet. Patient understands to soak in hot tub and wear clean underwear. He will return if worse.    Nat Christen, MD 05/20/15 2111

## 2015-05-20 NOTE — ED Notes (Signed)
Wound care. Patient given telfa nonstick, mesh underwear and pad to wear. Given 3 Chuck pads to use in car and in bed at night in case of drainage.

## 2015-05-31 DIAGNOSIS — K610 Anal abscess: Secondary | ICD-10-CM | POA: Diagnosis not present

## 2015-06-14 DIAGNOSIS — M2550 Pain in unspecified joint: Secondary | ICD-10-CM | POA: Diagnosis not present

## 2015-06-14 DIAGNOSIS — M0589 Other rheumatoid arthritis with rheumatoid factor of multiple sites: Secondary | ICD-10-CM | POA: Diagnosis not present

## 2015-06-14 DIAGNOSIS — Z79899 Other long term (current) drug therapy: Secondary | ICD-10-CM | POA: Diagnosis not present

## 2015-09-06 DIAGNOSIS — H6992 Unspecified Eustachian tube disorder, left ear: Secondary | ICD-10-CM | POA: Diagnosis not present

## 2015-09-06 DIAGNOSIS — F410 Panic disorder [episodic paroxysmal anxiety] without agoraphobia: Secondary | ICD-10-CM | POA: Diagnosis not present

## 2015-09-06 DIAGNOSIS — M069 Rheumatoid arthritis, unspecified: Secondary | ICD-10-CM | POA: Diagnosis not present

## 2015-09-06 DIAGNOSIS — K210 Gastro-esophageal reflux disease with esophagitis: Secondary | ICD-10-CM | POA: Diagnosis not present

## 2015-12-19 DIAGNOSIS — Z79899 Other long term (current) drug therapy: Secondary | ICD-10-CM | POA: Diagnosis not present

## 2015-12-19 DIAGNOSIS — M2550 Pain in unspecified joint: Secondary | ICD-10-CM | POA: Diagnosis not present

## 2015-12-19 DIAGNOSIS — M545 Low back pain: Secondary | ICD-10-CM | POA: Diagnosis not present

## 2015-12-19 DIAGNOSIS — M0589 Other rheumatoid arthritis with rheumatoid factor of multiple sites: Secondary | ICD-10-CM | POA: Diagnosis not present

## 2016-01-15 DIAGNOSIS — Z23 Encounter for immunization: Secondary | ICD-10-CM | POA: Diagnosis not present

## 2016-03-01 DIAGNOSIS — L03011 Cellulitis of right finger: Secondary | ICD-10-CM | POA: Diagnosis not present

## 2016-03-08 DIAGNOSIS — M069 Rheumatoid arthritis, unspecified: Secondary | ICD-10-CM | POA: Diagnosis not present

## 2016-03-08 DIAGNOSIS — K210 Gastro-esophageal reflux disease with esophagitis: Secondary | ICD-10-CM | POA: Diagnosis not present

## 2016-03-08 DIAGNOSIS — E162 Hypoglycemia, unspecified: Secondary | ICD-10-CM | POA: Diagnosis not present

## 2016-03-08 DIAGNOSIS — M545 Low back pain: Secondary | ICD-10-CM | POA: Diagnosis not present

## 2016-03-08 DIAGNOSIS — F410 Panic disorder [episodic paroxysmal anxiety] without agoraphobia: Secondary | ICD-10-CM | POA: Diagnosis not present

## 2016-03-25 ENCOUNTER — Emergency Department (HOSPITAL_COMMUNITY): Payer: BLUE CROSS/BLUE SHIELD

## 2016-03-25 ENCOUNTER — Encounter (HOSPITAL_COMMUNITY): Payer: Self-pay | Admitting: Emergency Medicine

## 2016-03-25 ENCOUNTER — Observation Stay (HOSPITAL_COMMUNITY)
Admission: EM | Admit: 2016-03-25 | Discharge: 2016-03-26 | Disposition: A | Discharge status: Home / Self Care | Payer: BLUE CROSS/BLUE SHIELD | Attending: Pulmonary Disease | Admitting: Pulmonary Disease

## 2016-03-25 DIAGNOSIS — F17200 Nicotine dependence, unspecified, uncomplicated: Secondary | ICD-10-CM | POA: Diagnosis present

## 2016-03-25 DIAGNOSIS — R42 Dizziness and giddiness: Secondary | ICD-10-CM | POA: Insufficient documentation

## 2016-03-25 DIAGNOSIS — R739 Hyperglycemia, unspecified: Secondary | ICD-10-CM | POA: Diagnosis present

## 2016-03-25 DIAGNOSIS — R079 Chest pain, unspecified: Principal | ICD-10-CM | POA: Diagnosis present

## 2016-03-25 DIAGNOSIS — F17210 Nicotine dependence, cigarettes, uncomplicated: Secondary | ICD-10-CM | POA: Diagnosis not present

## 2016-03-25 DIAGNOSIS — Z79899 Other long term (current) drug therapy: Secondary | ICD-10-CM | POA: Insufficient documentation

## 2016-03-25 DIAGNOSIS — R001 Bradycardia, unspecified: Secondary | ICD-10-CM | POA: Insufficient documentation

## 2016-03-25 DIAGNOSIS — F419 Anxiety disorder, unspecified: Secondary | ICD-10-CM | POA: Insufficient documentation

## 2016-03-25 DIAGNOSIS — M069 Rheumatoid arthritis, unspecified: Secondary | ICD-10-CM | POA: Diagnosis present

## 2016-03-25 DIAGNOSIS — Z8249 Family history of ischemic heart disease and other diseases of the circulatory system: Secondary | ICD-10-CM | POA: Diagnosis not present

## 2016-03-25 DIAGNOSIS — L03011 Cellulitis of right finger: Secondary | ICD-10-CM | POA: Diagnosis not present

## 2016-03-25 DIAGNOSIS — R0602 Shortness of breath: Secondary | ICD-10-CM | POA: Diagnosis not present

## 2016-03-25 DIAGNOSIS — R072 Precordial pain: Secondary | ICD-10-CM | POA: Diagnosis not present

## 2016-03-25 HISTORY — DX: Rheumatoid arthritis, unspecified: M06.9

## 2016-03-25 HISTORY — DX: Systemic involvement of connective tissue, unspecified: M35.9

## 2016-03-25 LAB — TSH: TSH: 0.824 u[IU]/mL (ref 0.350–4.500)

## 2016-03-25 LAB — CBC
HEMATOCRIT: 45.7 % (ref 39.0–52.0)
HEMOGLOBIN: 15.8 g/dL (ref 13.0–17.0)
MCH: 32.8 pg (ref 26.0–34.0)
MCHC: 34.6 g/dL (ref 30.0–36.0)
MCV: 95 fL (ref 78.0–100.0)
PLATELETS: 200 10*3/uL (ref 150–400)
RBC: 4.81 MIL/uL (ref 4.22–5.81)
RDW: 12.5 % (ref 11.5–15.5)
WBC: 7.1 10*3/uL (ref 4.0–10.5)

## 2016-03-25 LAB — BASIC METABOLIC PANEL
ANION GAP: 6 (ref 5–15)
BUN: 17 mg/dL (ref 6–20)
CHLORIDE: 109 mmol/L (ref 101–111)
CO2: 24 mmol/L (ref 22–32)
Calcium: 9.1 mg/dL (ref 8.9–10.3)
Creatinine, Ser: 0.91 mg/dL (ref 0.61–1.24)
GFR calc non Af Amer: >60 mL/min (ref >60)
Glucose, Bld: 112 mg/dL — ABNORMAL HIGH (ref 65–99)
POTASSIUM: 3.9 mmol/L (ref 3.5–5.1)
SODIUM: 139 mmol/L (ref 135–145)

## 2016-03-25 LAB — LIPID PANEL
CHOL/HDL RATIO: 3.5 ratio
CHOLESTEROL: 183 mg/dL (ref 0–200)
HDL: 53 mg/dL (ref >40)
LDL Cholesterol: 116 mg/dL — ABNORMAL HIGH (ref 0–99)
Triglycerides: 71 mg/dL (ref <150)
VLDL: 14 mg/dL (ref 0–40)

## 2016-03-25 LAB — TROPONIN I: Troponin I: <0.03 ng/mL (ref <0.03)

## 2016-03-25 LAB — D-DIMER, QUANTITATIVE: D-Dimer, Quant: <0.27 ug/mL-FEU (ref 0.00–0.50)

## 2016-03-25 MED ORDER — PANTOPRAZOLE SODIUM 40 MG PO TBEC
40.0000 mg | DELAYED_RELEASE_TABLET | Freq: Every day | ORAL | Status: DC
  Administered 2016-03-26: 40 mg via ORAL
  Filled 2016-03-25: qty 1

## 2016-03-25 MED ORDER — ASPIRIN 81 MG PO CHEW
324.0000 mg | CHEWABLE_TABLET | Freq: Once | ORAL | Status: AC
  Administered 2016-03-25: 324 mg via ORAL
  Filled 2016-03-25: qty 4

## 2016-03-25 MED ORDER — ACETAMINOPHEN 325 MG PO TABS: 650.0000 mg | ORAL_TABLET | ORAL | Status: DC | PRN

## 2016-03-25 MED ORDER — NICOTINE 21 MG/24HR TD PT24: 21.0000 mg | MEDICATED_PATCH | Freq: Every day | TRANSDERMAL | Status: DC | PRN

## 2016-03-25 MED ORDER — MICONAZOLE NITRATE 2 % EX CREA
TOPICAL_CREAM | Freq: Two times a day (BID) | CUTANEOUS | Status: DC
  Filled 2016-03-25: qty 14

## 2016-03-25 MED ORDER — MORPHINE SULFATE (PF) 4 MG/ML IV SOLN
4.0000 mg | INTRAVENOUS | Status: DC | PRN
  Filled 2016-03-25: qty 1

## 2016-03-25 MED ORDER — NITROGLYCERIN 0.4 MG SL SUBL
0.4000 mg | SUBLINGUAL_TABLET | SUBLINGUAL | Status: DC | PRN
  Filled 2016-03-25: qty 1

## 2016-03-25 MED ORDER — ASPIRIN EC 325 MG PO TBEC
325.0000 mg | DELAYED_RELEASE_TABLET | Freq: Every day | ORAL | Status: DC
  Administered 2016-03-26: 325 mg via ORAL
  Filled 2016-03-25: qty 1

## 2016-03-25 MED ORDER — MORPHINE SULFATE (PF) 2 MG/ML IV SOLN: 2.0000 mg | INTRAVENOUS | Status: DC | PRN

## 2016-03-25 MED ORDER — ONDANSETRON HCL 4 MG/2ML IJ SOLN: 4.0000 mg | Freq: Four times a day (QID) | INTRAMUSCULAR | Status: DC | PRN

## 2016-03-25 MED ORDER — GI COCKTAIL ~~LOC~~: 30.0000 mL | Freq: Four times a day (QID) | ORAL | Status: DC | PRN

## 2016-03-25 MED ORDER — ALPRAZOLAM 1 MG PO TABS
1.0000 mg | ORAL_TABLET | Freq: Three times a day (TID) | ORAL | Status: DC
  Administered 2016-03-25 – 2016-03-26 (×2): 1 mg via ORAL
  Filled 2016-03-25 (×2): qty 1
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
## 2016-03-25 NOTE — ED Triage Notes (Signed)
Patient c/o mid-sternal chest pain x3 days. Per patient tingling in right arm yesterday. Per patient some shortness of breath and dizziness. Denies any cardiac hx. Per patient took 2 baby aspirin yesterday but none today.

## 2016-03-25 NOTE — ED Notes (Signed)
Pt currently denies chest pain

## 2016-03-25 NOTE — H&P (Signed)
History and Physical    Gary Woods DPO:[id number] DOB: 1970/08/06 DOA: 03/25/2016  PCP: [name], MD Consultants:  Rheumatology - [facility] Patient coming from: Home - lives with girlfriend and 2 children; NOK: parents, [phone]  Chief Complaint: chest pain  HPI: Gary Woods is a 46 y.o. male with medical history significant of rheumatoid arthritis on Humira until the last month - taken off temporarily due to a paronychia.  Patient reports "I just haven't felt good". It all started about a month ago when he pulled a hangnail and developed a paronychia.   Took 1 antibiotic, no improvement.  Got a stronger one and the problem is still bothering him.  In the meantime, he has not had Humira for 3-4 weeks.  He also notices that he occasionally gets a little light-headed and occasional chest tightness - maybe related, but not always together.  Friday, he had both; on other days he was just light-headed.  He describes the chest pain as a slight discomfort in the center of his chest, like something sitting on his chest.  Happened again yesterday and today.  This AM, he was drinking coffee when it started; it lasted maybe 30 minutes. No SOB/nausea/diaphoresis.  Has some night sweats, worsening in the last few years.  Gave up sugary drinks and has lost about 40 pounds in the last 3-4 years.  Has never had a stress test.   ED Course: Given ASA.  Evaluation unremarkable.  Review of Systems: As per HPI; otherwise 10 point review of systems reviewed and negative.   Ambulatory Status:  ambulates without difficulty  Past Medical History:  Diagnosis Date  . Rheumatoid arthritis ([place])     History reviewed. No pertinent surgical history.  Social History   Social History  . Marital status: Married    Spouse name: N/A  . Number of children: N/A  . Years of education: N/A   Occupational History  . Architect    Social History Main Topics  . Smoking status: Current Every Day  Smoker    Packs/day: 1.00    Years: 25.00    Types: Cigarettes  . Smokeless tobacco: Never Used  . Alcohol use No  . Drug use: Yes    Types: Marijuana     Comment: last use yesterday  . Sexual activity: Not on file   Other Topics Concern  . Not on file   Social History Narrative  . No narrative on file    No Known Allergies  Family History  Problem Relation Age of Onset  . CAD Neg Hx     Prior to Admission medications   Medication Sig Start Date End Date Taking? Authorizing Provider  ALPRAZolam Duanne Moron) 1 MG tablet Take 1 tablet by mouth 3 (three) times daily. 03/10/16  Yes Historical Provider, MD  HUMIRA PEN 40 MG/0.8ML PNKT Inject 0.8 mLs into the muscle once a week. 03/14/16  Yes Historical Provider, MD  omeprazole (PRILOSEC) 20 MG capsule Take 1 capsule by mouth daily. 03/10/16  Yes Historical Provider, MD  doxycycline (VIBRA-TABS) 100 MG tablet Take 1 tablet by mouth 2 (two) times daily. 03/22/16   Historical Provider, MD  sulfamethoxazole-trimethoprim (BACTRIM DS,SEPTRA DS) 800-160 MG tablet Take 1 tablet by mouth 2 (two) times daily. 03/01/16   Historical Provider, MD    Physical Exam: Vitals:   03/25/16 1630 03/25/16 1720 03/25/16 1730 03/25/16 1814  BP: 121/68  119/70 116/68  Pulse: (!) 52 (!) 57 (!) 54 (!) 49  Resp: 19  14 25 (!) 24  Temp:    97.5 F (36.4 C)  TempSrc:    Oral  SpO2: 98% 97% 98% 100%  Weight:    95.1 kg (209 lb 9.6 oz)  Height:    5\' 11"  (1.803 m)     General:  Appears calm and comfortable and is NAD Eyes:  PERRL, EOMI, normal lids, iris ENT:  grossly normal hearing, lips & tongue, mmm Neck:  no LAD, masses or thyromegaly Cardiovascular:  RRR, no m/r/g. No LE edema.  Respiratory:  CTA bilaterally, no w/r/r. Normal respiratory effort. Abdomen:  soft, ntnd, NABS Skin:  Mild erythema/edema of right lateral middle finger with mild crusting along the nail edge Musculoskeletal:  grossly normal tone BUE/BLE, good ROM, no bony  abnormality Psychiatric:  grossly normal mood and affect, speech fluent and appropriate, AOx3 Neurologic:  CN 2-12 grossly intact, moves all extremities in coordinated fashion, sensation intact  Labs on Admission: I have personally reviewed following labs and imaging studies  CBC:  Recent Labs Lab 03/25/16 1405  WBC 7.1  HGB 15.8  HCT 45.7  MCV 95.0  PLT 678   Basic Metabolic Panel:  Recent Labs Lab 03/25/16 1405  NA 139  K 3.9  CL 109  CO2 24  GLUCOSE 112*  BUN 17  CREATININE 0.91  CALCIUM 9.1   GFR: Estimated Creatinine Clearance: 120.6 mL/min (by C-G formula based on SCr of 0.91 mg/dL). Liver Function Tests: No results for input(s): AST, ALT, ALKPHOS, BILITOT, PROT, ALBUMIN in the last 168 hours. No results for input(s): LIPASE, AMYLASE in the last 168 hours. No results for input(s): AMMONIA in the last 168 hours. Coagulation Profile: No results for input(s): INR, PROTIME in the last 168 hours. Cardiac Enzymes:  Recent Labs Lab 03/25/16 1405  TROPONINI <0.03   BNP (last 3 results) No results for input(s): PROBNP in the last 8760 hours. HbA1C: No results for input(s): HGBA1C in the last 72 hours. CBG: No results for input(s): GLUCAP in the last 168 hours. Lipid Profile: No results for input(s): CHOL, HDL, LDLCALC, TRIG, CHOLHDL, LDLDIRECT in the last 72 hours. Thyroid Function Tests: No results for input(s): TSH, T4TOTAL, FREET4, T3FREE, THYROIDAB in the last 72 hours. Anemia Panel: No results for input(s): VITAMINB12, FOLATE, FERRITIN, TIBC, IRON, RETICCTPCT in the last 72 hours. Urine analysis: No results found for: COLORURINE, APPEARANCEUR, LABSPEC, PHURINE, GLUCOSEU, HGBUR, BILIRUBINUR, KETONESUR, PROTEINUR, UROBILINOGEN, NITRITE, LEUKOCYTESUR  Creatinine Clearance: Estimated Creatinine Clearance: 120.6 mL/min (by C-G formula based on SCr of 0.91 mg/dL).  Sepsis Labs: @LABRCNTIP (procalcitonin:4,lacticidven:4) )No results found for this or any  previous visit (from the past 240 hour(s)).   Radiological Exams on Admission: Dg Chest 2 View  Result Date: 03/25/2016 CLINICAL DATA:  Dizziness.  Chest pain.  Shortness of breath. EXAM: CHEST  2 VIEW COMPARISON:  One-view chest x-ray 05/30/2012. FINDINGS: The heart size and mediastinal contours are within normal limits. Both lungs are clear. The visualized skeletal structures are unremarkable. IMPRESSION: Negative two view chest x-ray Electronically Signed   By: San Morelle M.D.   On: 03/25/2016 13:32    EKG: Independently reviewed.  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 -1/3 typical symptoms suggestive of noncardiac chest pain.  -CXR unremarkable.   -Initial cardiac troponin negative.  -D-dimer 0.27 -EKG not indicative of acute ischemia.   -GRACE score is 78; which predicts an in-hospital death rate of 0.4%.  -Will plan to place in observation status on telemetry to rule out ACS by overnight observation.  -cycle troponin q6h x 3 and repeat EKG in AM -Start ASA 81 mg  daily -morphine given -Risk factor stratification with HgbA1c and FLP; will also check TSH and UDS -Cardiology consultation in AM - NPO for possible stress test  -Will plan to start Heparin drip if enzymes are positive and/or chest pain recurs -Does not have apparent CVD RF to include HTN, HLD, DM, FH. -Will monitor BP while hospitalized.  Tobacco dependence -Encourage cessation.  This was discussed with the patient and should be reviewed on an ongoing basis.   Patch ordered prn.  Hyperglycemia -Glucose 112 -Will check A1c -There is no  indication to start medication at this time  Chronic paronychia -Patient has been treated with doxycycline and Septra without improvement. -Suspect chronic paronychia resulting from yeast at this time -Will start miconzaole topical BID -Patient should be encouraged to keep his hand clean and dry whenever possible  RA -Since he is no longer thought to have a bacterial infection, it would be reasonable to resume his Humira -I have encouraged him to call his rheumatologist to confirm that this is reasonable prior to resuming treatment  DVT prophylaxis: Early ambulation Code Status:  Full - confirmed with patient/family Family Communication: Girlfriend present throughout encounter  Disposition Plan:  Home once clinically improved Consults called: Cardiology  Admission status: It is my clinical opinion that referral for OBSERVATION is reasonable and necessary in this patient based on the above information provided. The aforementioned taken together are felt to place the patient at high risk for further clinical deterioration. However it is anticipated that the patient may be medically stable for discharge from the hospital within 24 to 48 hours.    Karmen Bongo MD [facility]  If 7PM-7AM, please contact night-coverage [email or web address] Password [email or web address]  03/25/2016, 7:30 PM

## 2016-03-26 ENCOUNTER — Observation Stay (HOSPITAL_COMMUNITY): Payer: BLUE CROSS/BLUE SHIELD

## 2016-03-26 ENCOUNTER — Observation Stay (HOSPITAL_BASED_OUTPATIENT_CLINIC_OR_DEPARTMENT_OTHER): Payer: BLUE CROSS/BLUE SHIELD

## 2016-03-26 ENCOUNTER — Encounter (HOSPITAL_COMMUNITY): Payer: Self-pay

## 2016-03-26 DIAGNOSIS — R001 Bradycardia, unspecified: Secondary | ICD-10-CM

## 2016-03-26 DIAGNOSIS — R739 Hyperglycemia, unspecified: Secondary | ICD-10-CM | POA: Diagnosis not present

## 2016-03-26 DIAGNOSIS — R42 Dizziness and giddiness: Secondary | ICD-10-CM

## 2016-03-26 DIAGNOSIS — L03011 Cellulitis of right finger: Secondary | ICD-10-CM | POA: Diagnosis not present

## 2016-03-26 DIAGNOSIS — M069 Rheumatoid arthritis, unspecified: Secondary | ICD-10-CM | POA: Diagnosis not present

## 2016-03-26 DIAGNOSIS — Z716 Tobacco abuse counseling: Secondary | ICD-10-CM

## 2016-03-26 DIAGNOSIS — R079 Chest pain, unspecified: Secondary | ICD-10-CM

## 2016-03-26 LAB — NM MYOCAR MULTI W/SPECT W/WALL MOTION / EF
CHL CUP NUCLEAR SDS: 1
CHL CUP NUCLEAR SRS: 1
CHL CUP RESTING HR STRESS: 53 {beats}/min
CSEPEDS: 52 s
CSEPEW: 10.1 METS
CSEPPHR: 130 {beats}/min
Exercise duration (min): 8 min
LVDIAVOL: 117 mL (ref 62–150)
LVSYSVOL: 39 mL
MPHR: 175 {beats}/min
NUC STRESS TID: 0.99
Percent HR: 74 %
RATE: 0.32
RPE: 13
SSS: 2

## 2016-03-26 LAB — TROPONIN I

## 2016-03-26 MED ORDER — REGADENOSON 0.4 MG/5ML IV SOLN
INTRAVENOUS | Status: AC
  Administered 2016-03-26: 0.4 mg via INTRAVENOUS
  Filled 2016-03-26: qty 5

## 2016-03-26 MED ORDER — TECHNETIUM TC 99M TETROFOSMIN IV KIT
10.0000 | PACK | Freq: Once | INTRAVENOUS | Status: AC | PRN
  Administered 2016-03-26: 11 via INTRAVENOUS

## 2016-03-26 MED ORDER — TECHNETIUM TC 99M TETROFOSMIN IV KIT
30.0000 | PACK | Freq: Once | INTRAVENOUS | Status: AC | PRN
  Administered 2016-03-26: 33 via INTRAVENOUS

## 2016-03-26 MED ORDER — NYSTATIN 100000 UNIT/GM EX CREA: TOPICAL_CREAM | Freq: Two times a day (BID) | CUTANEOUS | 0 refills | Status: DC

## 2016-03-26 MED ORDER — NYSTATIN 100000 UNIT/GM EX CREA
TOPICAL_CREAM | Freq: Two times a day (BID) | CUTANEOUS | Status: DC
  Administered 2016-03-26: 11:00:00 via TOPICAL
  Filled 2016-03-26: qty 15

## 2016-03-26 MED ORDER — SODIUM CHLORIDE 0.9% FLUSH
INTRAVENOUS | Status: AC
  Administered 2016-03-26: 10 mL via INTRAVENOUS
  Filled 2016-03-26: qty 10

## 2016-03-26 NOTE — Consult Note (Signed)
CARDIOLOGY CONSULT NOTE   Patient ID: Gary Woods MRN: [id number] DOB/AGE: 1970-11-09 46 y.o.  Admit Date: 03/25/2016 Referring Physician: Erasmo Score MD Primary Physician: Gary Bogus, MD Consulting Cardiologist: Gary Sable MD Primary Cardiologist: New Reason for Consultation:Chest Pain   Clinical Summary Gary Woods is a 46 y.o.male with no prior history of CAD, being treated for rheumatoid arthritis on Humira, but taken off recently in the setting of paronychia after pulling a hangnail and is being treated with antibiotics at home, finished dose, and was to start a new regimen when symptoms worsened concerning dizziness and chest tightness. Complains of chest pain and pressure with associated dizziness, worsening over the last two days. The day of admission he drank some coffee and had recurrence of chest pain which lasted approximately 30 minutes.   He states he has had some dizziness and "not feeling right"for about a week. Dizziness occurs when he bends over.  On arrival to ER BP 133/69 HR 68, O2 Sat 100%. Pertinent labs: Troponin negative X 3. Chemistries and CBC unremarkable. EKG sinus bradycardia without acute ST-T wave abnormalities. CXR negative for CHF or pneumonia.    No Known Allergies  Medications Scheduled Medications: . ALPRAZolam  1 mg Oral TID  . aspirin EC  325 mg Oral Daily  . miconazole   Topical BID  . pantoprazole  40 mg Oral Daily      PRN Medications:  acetaminophen, gi cocktail, morphine injection, nicotine, ondansetron (ZOFRAN) IV   Past Medical History:  Diagnosis Date  . Rheumatoid arthritis ([place])     History reviewed. No pertinent surgical history.  Family History  Problem Relation Age of Onset  . CAD Neg Hx      Social History Gary Woods reports that he has been smoking Cigarettes.  He has a 25.00 pack-year smoking history. He has never used smokeless tobacco. Gary Woods reports that he does not drink  alcohol.  Review of Systems Complete review of systems are found to be negative unless outlined in H&P above.  Physical Examination Blood pressure 107/63, pulse (!) 56, temperature 97.4 F (36.3 C), temperature source Oral, resp. rate 20, height 5\' 11"  (1.803 m), weight 209 lb 9.6 oz (95.1 kg), SpO2 99 %. No intake or output data in the 24 hours ending 03/26/16 0721  Telemetry:  GEN: No acute distress  HEENT: Conjunctiva and lids normal, oropharynx clear with moist mucosa. Neck: Supple, no elevated JVP or carotid bruits, no thyromegaly. Lungs: Clear to auscultation, nonlabored breathing at rest. Cardiac: Regular rate and rhythm, no S3 or significant systolic murmur, no pericardial rub. Abdomen: Soft, nontender, no hepatomegaly, bowel sounds present, no guarding or rebound. Extremities: No pitting edema, distal pulses 2+. Mild edema of 3rd finger of right hand.  Skin: Warm and dry. Musculoskeletal: No kyphosis. Neuropsychiatric: Alert and oriented x3, affect grossly appropriate.  Prior Cardiac Testing/Procedures  Lab Results  Basic Metabolic Panel:  Recent Labs Lab 03/25/16 1405  NA 139  K 3.9  CL 109  CO2 24  GLUCOSE 112*  BUN 17  CREATININE 0.91  CALCIUM 9.1    CBC:  Recent Labs Lab 03/25/16 1405  WBC 7.1  HGB 15.8  HCT 45.7  MCV 95.0  PLT 200    Cardiac Enzymes:  Recent Labs Lab 03/25/16 1405 03/25/16 1944 03/26/16 0030 03/26/16 0618  TROPONINI <0.03 <0.03 <0.03 <0.03    Radiology: Dg Chest 2 View  Result Date: 03/25/2016 CLINICAL DATA:  Dizziness.  Chest pain.  Shortness of  breath. EXAM: CHEST  2 VIEW COMPARISON:  One-view chest x-ray 05/30/2012. FINDINGS: The heart size and mediastinal contours are within normal limits. Both lungs are clear. The visualized skeletal structures are unremarkable. IMPRESSION: Negative two view chest x-ray Electronically Signed   By: San Morelle M.D.   On: 03/25/2016 13:32     ECG: Sinus bradycardia HR  47 bpm.    Impression and Recommendations  1. Chest Pain; Typical and atypical symptoms with associated dizziness, but no dyspnea or diaphoresis, described as tightness. Non radiating. No prior cardiac hx. EKG and troponin argue against ACS. CVRF: Gender and tobacco abuse. Consider stress test for diagnostic/prognostic purposes either inpatient or outpatient. Echocardiogram for evaluation of LV fx and valvular abnormalities.   2. Rheumatoid Arthritis: Has been off of Humira in the setting of treatment for paronychia. On antibiotics at home, finished dose, and was to start a new regimen when symptoms worsened concerning dizziness and chest tightness.   3 Bradycardia:  Heart rate in the 40's to 50's at rest. No pauses. Possible etiology of dizziness. TSH 0.824.   4. Ongoing Tobacco abuse: 30 pk yr.    Signed: Phill Woods. Gary Woods  03/26/2016, 7:21 AM Co-Sign MD  The patient was seen and examined, and I agree with the history, physical exam, assessment and plan as documented above, with modifications as noted below. 46 yr old male with h/o rheumatoid arthritis and tobacco abuse presented to ED with epigastric chest pressure and dizziness, primarily when bending over and standing up. Denies exertional chest pain and dyspnea.  Has had some fatigue over the last few months which he attributed to "wintertime blues".  ECG showed sinus bradycardia. Troponins, chest xray, and TSH were normal.  I personally interpreted the nuclear stress test which showed soft tissue attenuation artifact but no definitive myocardial ischemia or scar, with normal wall motion, LVEF 66%.  Unclear what symptoms represented but low risk for CAD based on stress test results. We talked about tobacco cessation and he seriously wants to quit.  I will arrange for outpatient follow up with me in the next 4-6 weeks to monitor for symptom recurrence.   Gary Sable, MD, [facility]  03/26/2016 11:33 AM

## 2016-03-26 NOTE — Progress Notes (Signed)
Subjective: He says he feels okay now. He's not having any chest tightness at the moment. Troponins thus far have been negative. No other new complaints. Cardiology consultation is underway.  Objective: Vital signs in last 24 hours: Temp:  [97.4 F (36.3 C)-98 F (36.7 C)] 97.4 F (36.3 C) (03/12 6578) Pulse Rate:  [49-68] 56 (03/12 0613) Resp:  [14-25] 20 (03/12 0613) BP: (100-133)/(63-89) 107/63 (03/12 0613) SpO2:  [97 %-100 %] 99 % (03/12 0613) Weight:  [90.7 kg (200 lb)-95.1 kg (209 lb 9.6 oz)] 95.1 kg (209 lb 9.6 oz) (03/11 1814) Weight change:  Last BM Date: 03/25/16  Intake/Output from previous day: No intake/output data recorded.  PHYSICAL EXAM General appearance: alert, cooperative and no distress Resp: clear to auscultation bilaterally Cardio: regular rate and rhythm, S1, S2 normal, no murmur, click, rub or gallop GI: soft, non-tender; bowel sounds normal; no masses,  no organomegaly Extremities: extremities normal, atraumatic, no cyanosis or edema Skin warm and dry. Mucous membranes are moist  Lab Results:  Results for orders placed or performed during the hospital encounter of 03/25/16 (from the past 48 hour(s))  Basic metabolic panel     Status: Abnormal   Collection Time: 03/25/16  2:05 PM  Result Value Ref Range   Sodium 139 135 - 145 mmol/L   Potassium 3.9 3.5 - 5.1 mmol/L   Chloride 109 101 - 111 mmol/L   CO2 24 22 - 32 mmol/L   Glucose, Bld 112 (H) 65 - 99 mg/dL   BUN 17 6 - 20 mg/dL   Creatinine, Ser 0.91 0.61 - 1.24 mg/dL   Calcium 9.1 8.9 - 10.3 mg/dL   GFR calc non Af Amer >60 >60 mL/min   GFR calc Af Amer >60 >60 mL/min    Comment: (NOTE) The eGFR has been calculated using the CKD EPI equation. This calculation has not been validated in all clinical situations. eGFR's persistently <60 mL/min signify possible Chronic Kidney Disease.    Anion gap 6 5 - 15  CBC     Status: None   Collection Time: 03/25/16  2:05 PM  Result Value Ref Range   WBC  7.1 4.0 - 10.5 K/uL   RBC 4.81 4.22 - 5.81 MIL/uL   Hemoglobin 15.8 13.0 - 17.0 g/dL   HCT 45.7 39.0 - 52.0 %   MCV 95.0 78.0 - 100.0 fL   MCH 32.8 26.0 - 34.0 pg   MCHC 34.6 30.0 - 36.0 g/dL   RDW 12.5 11.5 - 15.5 %   Platelets 200 150 - 400 K/uL  Troponin I     Status: None   Collection Time: 03/25/16  2:05 PM  Result Value Ref Range   Troponin I <0.03 <0.03 ng/mL  D-dimer, quantitative     Status: None   Collection Time: 03/25/16  2:05 PM  Result Value Ref Range   D-Dimer, Quant <0.27 0.00 - 0.50 ug/mL-FEU    Comment: (NOTE) At the manufacturer cut-off of 0.50 ug/mL FEU, this assay has been documented to exclude PE with a sensitivity and negative predictive value of 97 to 99%.  At this time, this assay has not been approved by the FDA to exclude DVT/VTE. Results should be correlated with clinical presentation.   Troponin I-serum (0, 3, 6 hours)     Status: None   Collection Time: 03/25/16  7:44 PM  Result Value Ref Range   Troponin I <0.03 <0.03 ng/mL  TSH     Status: None   Collection Time: 03/25/16  7:44 PM  Result Value Ref Range   TSH 0.824 0.350 - 4.500 uIU/mL    Comment: Performed by a 3rd Generation assay with a functional sensitivity of <=0.01 uIU/mL.  Lipid panel     Status: Abnormal   Collection Time: 03/25/16  7:44 PM  Result Value Ref Range   Cholesterol 183 0 - 200 mg/dL   Triglycerides 71 <150 mg/dL   HDL 53 >40 mg/dL   Total CHOL/HDL Ratio 3.5 RATIO   VLDL 14 0 - 40 mg/dL   LDL Cholesterol 116 (H) 0 - 99 mg/dL    Comment:        Total Cholesterol/HDL:CHD Risk Coronary Heart Disease Risk Table                     Men   Women  1/2 Average Risk   3.4   3.3  Average Risk       5.0   4.4  2 X Average Risk   9.6   7.1  3 X Average Risk  23.4   11.0        Use the calculated Patient Ratio above and the CHD Risk Table to determine the patient's CHD Risk.        ATP III CLASSIFICATION (LDL):  <100     mg/dL   Optimal  100-129  mg/dL   Near or Above                     Optimal  130-159  mg/dL   Borderline  160-189  mg/dL   High  >190     mg/dL   Very High   Troponin I-serum (0, 3, 6 hours)     Status: None   Collection Time: 03/26/16 12:30 AM  Result Value Ref Range   Troponin I <0.03 <0.03 ng/mL  Troponin I-serum (0, 3, 6 hours)     Status: None   Collection Time: 03/26/16  6:18 AM  Result Value Ref Range   Troponin I <0.03 <0.03 ng/mL    ABGS No results for input(s): PHART, PO2ART, TCO2, HCO3 in the last 72 hours.  Invalid input(s): PCO2 CULTURES No results found for this or any previous visit (from the past 240 hour(s)). Studies/Results: Dg Chest 2 View  Result Date: 03/25/2016 CLINICAL DATA:  Dizziness.  Chest pain.  Shortness of breath. EXAM: CHEST  2 VIEW COMPARISON:  One-view chest x-ray 05/30/2012. FINDINGS: The heart size and mediastinal contours are within normal limits. Both lungs are clear. The visualized skeletal structures are unremarkable. IMPRESSION: Negative two view chest x-ray Electronically Signed   By: San Morelle M.D.   On: 03/25/2016 13:32    Medications:  Prior to Admission:  Prescriptions Prior to Admission  Medication Sig Dispense Refill Last Dose  . ALPRAZolam (XANAX) 1 MG tablet Take 1 tablet by mouth 3 (three) times daily.  4 03/25/2016 at Unknown time  . HUMIRA PEN 40 MG/0.8ML PNKT Inject 0.8 mLs into the muscle once a week.  0 Past Month at Unknown time  . omeprazole (PRILOSEC) 20 MG capsule Take 1 capsule by mouth daily.  12 03/25/2016 at Unknown time  . [DISCONTINUED] doxycycline (VIBRA-TABS) 100 MG tablet Take 1 tablet by mouth 2 (two) times daily.  0 Completed Course at Unknown time  . [DISCONTINUED] sulfamethoxazole-trimethoprim (BACTRIM DS,SEPTRA DS) 800-160 MG tablet Take 1 tablet by mouth 2 (two) times daily.  0 Completed Course at Unknown time   Scheduled: . ALPRAZolam  1 mg Oral TID  . aspirin EC  325 mg Oral Daily  . nystatin cream   Topical BID  . pantoprazole  40 mg Oral  Daily   Continuous:  NPM:[id number], gi cocktail, morphine injection, nicotine, ondansetron (ZOFRAN) IV, technetium tetrofosmin  Assesment: He was admitted with chest discomfort. He is better. He's had some bradycardia. Cardiology consultation is underway and plans are for him to have echocardiogram and stress test. 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

## 2016-03-27 LAB — HEMOGLOBIN A1C
HEMOGLOBIN A1C: 5.4 % (ref 4.8–5.6)
MEAN PLASMA GLUCOSE: 108 mg/dL

## 2016-03-27 LAB — HIV ANTIBODY (ROUTINE TESTING W REFLEX): HIV SCREEN 4TH GENERATION: NONREACTIVE

## 2016-03-28 NOTE — Discharge Summary (Signed)
Physician Discharge Summary  Patient ID: Gary Woods MRN: [id number] DOB/AGE: 1970/12/23 46 y.o. 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   HUMIRA PEN 40 MG/0.8ML Pnkt Generic drug:  Adalimumab Inject 0.8 mLs into the muscle once a week.   nystatin cream Commonly known as:  MYCOSTATIN Apply topically 2 (two) times daily.   omeprazole 20 MG capsule Commonly known as:  PRILOSEC Take 1 capsule by mouth daily.       Discharged Condition:Improved    Consults: Cardiology  Significant Diagnostic Studies: Dg Chest 2 View  Result Date: 03/25/2016 CLINICAL DATA:  Dizziness.  Chest pain.  Shortness of breath. EXAM: CHEST  2 VIEW COMPARISON:  One-view chest x-ray 05/30/2012. FINDINGS: The heart size and mediastinal contours are within normal limits. Both lungs are clear. The visualized skeletal structures are unremarkable. IMPRESSION: Negative two view chest x-ray Electronically Signed   By: San Morelle M.D.   On: 03/25/2016 13:32   Nm Myocar Multi W/spect W/wall Motion / Ef  Result Date: 03/26/2016  Blood pressure demonstrated a normal response to exercise.  There was no ST segment deviation noted during stress.  Defect 1: There is a medium defect of moderate severity present in the basal inferior and mid inferior location. This appears to be due to soft tissue attenuation artifact. There is no myocardial ischemia or scar.  This is a low risk study.  Nuclear stress EF: 66%.     Lab Results: Basic Metabolic  Panel:  Recent Labs  03/25/16 1405  NA 139  K 3.9  CL 109  CO2 24  GLUCOSE 112*  BUN 17  CREATININE 0.91  CALCIUM 9.1   Liver Function Tests: No results for input(s): AST, ALT, ALKPHOS, BILITOT, PROT, ALBUMIN in the last 72 hours.   CBC:  Recent Labs  03/25/16 1405  WBC 7.1  HGB 15.8  HCT 45.7  MCV 95.0  PLT 200    No results found for this or any previous visit (from the past 240 hour(s)).   Hospital Course: This is a 46 year old who came to the emergency department because of chest tightness. This is been going on for 2-3 days. It was not necessarily exertional. He ruled out for MI. He had cardiology consultation and it was felt that he should have nuclear stress testing which was done. This was a low risk study. He was scheduled for echocardiogram but that was delayed so will be done as an outpatient. His pain had resolved by the time of discharge.  Discharge Exam: Blood pressure 107/63, pulse (!) 56, temperature 97.4 F (36.3 C), temperature source Oral, resp. rate 20, height 5\' 11"  (1.803 m), weight 95.1 kg (209 lb 9.6 oz), SpO2 99 %. He's awake and alert. He is mildly anxious. His chest is clear. Heart is regular.  Disposition: Home he does not need any home health services. He does need echocardiogram.    Follow-up Information    Kate Sable, MD On 05/08/2016.   Specialty:  Cardiology Why:  9:00  AM Contact information: [place] [address] [phone]           Signed: [name]   03/28/2016, 7:39 AM

## 2016-05-08 ENCOUNTER — Encounter: Payer: Self-pay | Admitting: Cardiovascular Disease

## 2016-05-08 ENCOUNTER — Ambulatory Visit (INDEPENDENT_AMBULATORY_CARE_PROVIDER_SITE_OTHER): Payer: BLUE CROSS/BLUE SHIELD | Admitting: Cardiovascular Disease

## 2016-05-08 VITALS — BP 128/78 | HR 62 | Ht 71.0 in | Wt 203.0 lb

## 2016-05-08 DIAGNOSIS — R0789 Other chest pain: Secondary | ICD-10-CM

## 2016-05-08 DIAGNOSIS — R42 Dizziness and giddiness: Secondary | ICD-10-CM | POA: Diagnosis not present

## 2016-05-08 DIAGNOSIS — Z716 Tobacco abuse counseling: Secondary | ICD-10-CM

## 2016-05-08 NOTE — Patient Instructions (Signed)
Your physician recommends that you schedule a follow-up appointment in: as needed   Thank you for choosing [facility] !         

## 2016-05-08 NOTE — Progress Notes (Signed)
SUBJECTIVE: The patient presents for posthospitalization follow-up. I saw him in consultation on 03/26/16. At that time he had been complaining of epigastric chest pressure and dizziness when bending over and standing up. He has a history of rheumatoid arthritis and tobacco abuse. ECG showed sinus bradycardia. Troponins, chest x-ray, and TSH were normal.  He underwent a nuclear stress test which did not reveal any evidence of myocardial ischemia or scar, LVEF 66%.  He has infrequently been having dizzy spells. They are not associated with exertion. He denies near syncope. He has occasional "hot flashes ".  He takes Xanax for a long-standing history of anxiety. It sometimes helps to alleviate dizziness.  He denies exertional chest pain and dyspnea. He has been doing a lot of [place] hunting and has had no physical limitations.  He wants to quit smoking. He started when he was a teenager.  His girlfriend is a cardiac sonographer at [employer].  Review of Systems: As per "subjective", otherwise negative.  No Known Allergies  Current Outpatient Prescriptions  Medication Sig Dispense Refill  . ALPRAZolam (XANAX) 1 MG tablet Take 1 tablet by mouth 3 (three) times daily.  4  . HUMIRA PEN 40 MG/0.8ML PNKT Inject 0.8 mLs into the muscle once a week.  0  . omeprazole (PRILOSEC) 20 MG capsule Take 1 capsule by mouth daily.  12   No current facility-administered medications for this visit.     Past Medical History:  Diagnosis Date  . Collagen vascular disease ([place])   . Rheumatoid arthritis ([place])     No past surgical history on file.  Social History   Social History  . Marital status: Married    Spouse name: N/A  . Number of children: N/A  . Years of education: N/A   Occupational History  . Architect    Social History Main Topics  . Smoking status: Current Every Day Smoker    Packs/day: 1.00    Years: 25.00    Types: Cigarettes  . Smokeless tobacco: Never Used  .  Alcohol use No  . Drug use: Yes    Types: Marijuana     Comment: last use yesterday  . Sexual activity: Yes   Other Topics Concern  . Not on file   Social History Narrative  . No narrative on file     Vitals:   05/08/16 0908  BP: 128/78  Pulse: 62  SpO2: 97%  Weight: 203 lb (92.1 kg)  Height: 5\' 11"  (1.803 m)    Wt Readings from Last 3 Encounters:  05/08/16 203 lb (92.1 kg)  03/25/16 209 lb 9.6 oz (95.1 kg)  05/20/15 200 lb (90.7 kg)     PHYSICAL EXAM General: NAD HEENT: Normal. Neck: No JVD, no thyromegaly. Lungs: Clear to auscultation bilaterally with normal respiratory effort. CV: Nondisplaced PMI.  Regular rate and rhythm, normal S1/S2, no S3/S4, no murmur. No pretibial or periankle edema.  No carotid bruit.   Abdomen: Soft, nontender, no distention.  Neurologic: Alert and oriented.  Psych: Normal affect. Skin: Normal. Musculoskeletal: No gross deformities.    ECG: Most recent ECG reviewed.   Labs: Lab Results  Component Value Date/Time   K 3.9 03/25/2016 02:05 PM   BUN 17 03/25/2016 02:05 PM   CREATININE 0.91 03/25/2016 02:05 PM   TSH 0.824 03/25/2016 07:44 PM   HGB 15.8 03/25/2016 02:05 PM     Lipids: Lab Results  Component Value Date/Time   LDLCALC 116 (H) 03/25/2016 07:44 PM  CHOL 183 03/25/2016 07:44 PM   TRIG 71 03/25/2016 07:44 PM   HDL 53 03/25/2016 07:44 PM       ASSESSMENT AND PLAN: 1. Chest pressure: No recurrences. Low risk nuclear stress test detailed above. No further testing indicated at this time. Tobacco cessation counseling provided.  2. Dizziness: He has seasonal allergies and recently (Flonase. He had previously seen an ENT specialist. I encouraged him to see one again as he has had problems with left sided ear pressure which may be contributing to his symptoms.  3. Tobacco cessation counseling provided (3 minutes).    Disposition: Follow up prn  Kate Sable, M.D., F.A.C.C.

## 2016-07-13 DIAGNOSIS — M2550 Pain in unspecified joint: Secondary | ICD-10-CM | POA: Diagnosis not present

## 2016-07-13 DIAGNOSIS — M0589 Other rheumatoid arthritis with rheumatoid factor of multiple sites: Secondary | ICD-10-CM | POA: Diagnosis not present

## 2016-07-13 DIAGNOSIS — Z79899 Other long term (current) drug therapy: Secondary | ICD-10-CM | POA: Diagnosis not present

## 2016-11-10 ENCOUNTER — Emergency Department (HOSPITAL_COMMUNITY)
Admission: EM | Admit: 2016-11-10 | Discharge: 2016-11-11 | Disposition: A | Discharge status: Home / Self Care | Payer: BLUE CROSS/BLUE SHIELD | Attending: Emergency Medicine | Admitting: Emergency Medicine

## 2016-11-10 ENCOUNTER — Emergency Department (HOSPITAL_COMMUNITY): Payer: BLUE CROSS/BLUE SHIELD

## 2016-11-10 ENCOUNTER — Encounter (HOSPITAL_COMMUNITY): Payer: Self-pay | Admitting: *Deleted

## 2016-11-10 DIAGNOSIS — Z79899 Other long term (current) drug therapy: Secondary | ICD-10-CM | POA: Insufficient documentation

## 2016-11-10 DIAGNOSIS — J209 Acute bronchitis, unspecified: Secondary | ICD-10-CM

## 2016-11-10 DIAGNOSIS — J019 Acute sinusitis, unspecified: Secondary | ICD-10-CM | POA: Diagnosis not present

## 2016-11-10 DIAGNOSIS — M069 Rheumatoid arthritis, unspecified: Secondary | ICD-10-CM | POA: Insufficient documentation

## 2016-11-10 DIAGNOSIS — F17210 Nicotine dependence, cigarettes, uncomplicated: Secondary | ICD-10-CM | POA: Insufficient documentation

## 2016-11-10 DIAGNOSIS — R05 Cough: Secondary | ICD-10-CM | POA: Diagnosis not present

## 2016-11-10 MED ORDER — AZITHROMYCIN 250 MG PO TABS: ORAL_TABLET | ORAL | 0 refills | Status: DC

## 2016-11-10 MED ORDER — ALBUTEROL SULFATE HFA 108 (90 BASE) MCG/ACT IN AERS
2.0000 | INHALATION_SPRAY | Freq: Once | RESPIRATORY_TRACT | Status: AC
  Administered 2016-11-11: 2 via RESPIRATORY_TRACT
  Filled 2016-11-10: qty 6.7

## 2016-11-10 MED ORDER — GUAIFENESIN-CODEINE 100-10 MG/5ML PO SOLN: 10.0000 mL | Freq: Four times a day (QID) | ORAL | 0 refills | Status: DC | PRN
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
## 2016-11-10 NOTE — ED Triage Notes (Signed)
Pt reports cough, congestion, generalized body aches, sore throat headache , and bilateral rib pain when coughing. Pt is also c/o chest pressure that is worse when he coughs.

## 2016-11-10 NOTE — Discharge Instructions (Signed)
Zithromax as prescribed.  Albuterol inhaler: 2 puffs every 4 hours as needed for wheezing.  Robitussin with codeine as needed for cough.  Follow up with your primary doctor if not improving in the next week.

## 2016-11-26 DIAGNOSIS — J209 Acute bronchitis, unspecified: Secondary | ICD-10-CM | POA: Diagnosis not present

## 2016-11-26 DIAGNOSIS — M069 Rheumatoid arthritis, unspecified: Secondary | ICD-10-CM | POA: Diagnosis not present

## 2016-11-26 DIAGNOSIS — K210 Gastro-esophageal reflux disease with esophagitis: Secondary | ICD-10-CM | POA: Diagnosis not present

## 2016-11-26 DIAGNOSIS — Z23 Encounter for immunization: Secondary | ICD-10-CM | POA: Diagnosis not present

## 2016-11-26 DIAGNOSIS — F410 Panic disorder [episodic paroxysmal anxiety] without agoraphobia: Secondary | ICD-10-CM | POA: Diagnosis not present

## 2017-01-04 DIAGNOSIS — M2550 Pain in unspecified joint: Secondary | ICD-10-CM | POA: Diagnosis not present

## 2017-01-04 DIAGNOSIS — M0589 Other rheumatoid arthritis with rheumatoid factor of multiple sites: Secondary | ICD-10-CM | POA: Diagnosis not present

## 2017-01-04 DIAGNOSIS — M545 Low back pain: Secondary | ICD-10-CM | POA: Diagnosis not present

## 2017-01-04 DIAGNOSIS — Z79899 Other long term (current) drug therapy: Secondary | ICD-10-CM | POA: Diagnosis not present

## 2017-01-29 DIAGNOSIS — F410 Panic disorder [episodic paroxysmal anxiety] without agoraphobia: Secondary | ICD-10-CM | POA: Diagnosis not present

## 2017-01-29 DIAGNOSIS — K210 Gastro-esophageal reflux disease with esophagitis: Secondary | ICD-10-CM | POA: Diagnosis not present

## 2017-01-29 DIAGNOSIS — F17200 Nicotine dependence, unspecified, uncomplicated: Secondary | ICD-10-CM | POA: Diagnosis not present

## 2017-01-29 DIAGNOSIS — M069 Rheumatoid arthritis, unspecified: Secondary | ICD-10-CM | POA: Diagnosis not present

## 2017-02-01 ENCOUNTER — Other Ambulatory Visit (HOSPITAL_COMMUNITY): Payer: Self-pay | Admitting: Pulmonary Disease

## 2017-02-01 DIAGNOSIS — R945 Abnormal results of liver function studies: Secondary | ICD-10-CM

## 2017-02-01 DIAGNOSIS — R7989 Other specified abnormal findings of blood chemistry: Secondary | ICD-10-CM

## 2017-02-05 ENCOUNTER — Ambulatory Visit (HOSPITAL_COMMUNITY)
Admission: RE | Admit: 2017-02-05 | Discharge: 2017-02-05 | Disposition: A | Discharge status: Home / Self Care | Payer: BLUE CROSS/BLUE SHIELD | Source: Ambulatory Visit | Attending: Pulmonary Disease | Admitting: Pulmonary Disease

## 2017-02-05 ENCOUNTER — Encounter (HOSPITAL_COMMUNITY): Payer: Self-pay

## 2017-02-06 DIAGNOSIS — R945 Abnormal results of liver function studies: Secondary | ICD-10-CM | POA: Diagnosis not present

## 2017-03-15 DIAGNOSIS — J209 Acute bronchitis, unspecified: Secondary | ICD-10-CM | POA: Diagnosis not present

## 2017-03-15 DIAGNOSIS — M069 Rheumatoid arthritis, unspecified: Secondary | ICD-10-CM | POA: Diagnosis not present

## 2017-03-15 DIAGNOSIS — F17200 Nicotine dependence, unspecified, uncomplicated: Secondary | ICD-10-CM | POA: Diagnosis not present

## 2017-03-21 DIAGNOSIS — M069 Rheumatoid arthritis, unspecified: Secondary | ICD-10-CM | POA: Diagnosis not present

## 2017-03-21 DIAGNOSIS — J209 Acute bronchitis, unspecified: Secondary | ICD-10-CM | POA: Diagnosis not present

## 2017-03-21 DIAGNOSIS — T2104XA Burn of unspecified degree of lower back, initial encounter: Secondary | ICD-10-CM | POA: Diagnosis not present

## 2017-06-24 ENCOUNTER — Ambulatory Visit (INDEPENDENT_AMBULATORY_CARE_PROVIDER_SITE_OTHER): Payer: BLUE CROSS/BLUE SHIELD | Admitting: Otolaryngology

## 2017-06-24 DIAGNOSIS — H900 Conductive hearing loss, bilateral: Secondary | ICD-10-CM

## 2017-06-24 DIAGNOSIS — H6983 Other specified disorders of Eustachian tube, bilateral: Secondary | ICD-10-CM | POA: Diagnosis not present

## 2017-06-24 DIAGNOSIS — H6123 Impacted cerumen, bilateral: Secondary | ICD-10-CM

## 2017-07-08 DIAGNOSIS — M2550 Pain in unspecified joint: Secondary | ICD-10-CM | POA: Diagnosis not present

## 2017-07-08 DIAGNOSIS — Z79899 Other long term (current) drug therapy: Secondary | ICD-10-CM | POA: Diagnosis not present

## 2017-07-08 DIAGNOSIS — M0589 Other rheumatoid arthritis with rheumatoid factor of multiple sites: Secondary | ICD-10-CM | POA: Diagnosis not present

## 2017-08-12 DIAGNOSIS — R945 Abnormal results of liver function studies: Secondary | ICD-10-CM | POA: Diagnosis not present

## 2017-10-07 DIAGNOSIS — M069 Rheumatoid arthritis, unspecified: Secondary | ICD-10-CM | POA: Diagnosis not present

## 2017-10-07 DIAGNOSIS — F17200 Nicotine dependence, unspecified, uncomplicated: Secondary | ICD-10-CM | POA: Diagnosis not present

## 2017-10-07 DIAGNOSIS — K210 Gastro-esophageal reflux disease with esophagitis: Secondary | ICD-10-CM | POA: Diagnosis not present

## 2017-10-07 DIAGNOSIS — J209 Acute bronchitis, unspecified: Secondary | ICD-10-CM | POA: Diagnosis not present

## 2017-12-23 ENCOUNTER — Ambulatory Visit (INDEPENDENT_AMBULATORY_CARE_PROVIDER_SITE_OTHER): Payer: BLUE CROSS/BLUE SHIELD | Admitting: Otolaryngology

## 2017-12-23 DIAGNOSIS — H6123 Impacted cerumen, bilateral: Secondary | ICD-10-CM | POA: Diagnosis not present

## 2018-01-02 DIAGNOSIS — M2550 Pain in unspecified joint: Secondary | ICD-10-CM | POA: Diagnosis not present

## 2018-01-02 DIAGNOSIS — M0589 Other rheumatoid arthritis with rheumatoid factor of multiple sites: Secondary | ICD-10-CM | POA: Diagnosis not present

## 2018-01-02 DIAGNOSIS — M545 Low back pain: Secondary | ICD-10-CM | POA: Diagnosis not present

## 2018-01-02 DIAGNOSIS — Z79899 Other long term (current) drug therapy: Secondary | ICD-10-CM | POA: Diagnosis not present

## 2018-01-29 DIAGNOSIS — F17200 Nicotine dependence, unspecified, uncomplicated: Secondary | ICD-10-CM | POA: Diagnosis not present

## 2018-01-29 DIAGNOSIS — F410 Panic disorder [episodic paroxysmal anxiety] without agoraphobia: Secondary | ICD-10-CM | POA: Diagnosis not present

## 2018-01-29 DIAGNOSIS — Z23 Encounter for immunization: Secondary | ICD-10-CM | POA: Diagnosis not present

## 2018-01-29 DIAGNOSIS — M545 Low back pain: Secondary | ICD-10-CM | POA: Diagnosis not present

## 2018-01-29 DIAGNOSIS — M069 Rheumatoid arthritis, unspecified: Secondary | ICD-10-CM | POA: Diagnosis not present

## 2018-03-21 DIAGNOSIS — K645 Perianal venous thrombosis: Secondary | ICD-10-CM | POA: Diagnosis not present

## 2018-04-01 ENCOUNTER — Ambulatory Visit: Payer: Self-pay | Admitting: General Surgery

## 2018-06-30 ENCOUNTER — Ambulatory Visit (INDEPENDENT_AMBULATORY_CARE_PROVIDER_SITE_OTHER): Payer: BLUE CROSS/BLUE SHIELD | Admitting: Otolaryngology

## 2018-06-30 DIAGNOSIS — H6123 Impacted cerumen, bilateral: Secondary | ICD-10-CM

## 2018-08-13 DIAGNOSIS — Z79899 Other long term (current) drug therapy: Secondary | ICD-10-CM | POA: Diagnosis not present

## 2018-08-13 DIAGNOSIS — M0589 Other rheumatoid arthritis with rheumatoid factor of multiple sites: Secondary | ICD-10-CM | POA: Diagnosis not present

## 2018-09-11 DIAGNOSIS — K210 Gastro-esophageal reflux disease with esophagitis: Secondary | ICD-10-CM | POA: Diagnosis not present

## 2018-09-11 DIAGNOSIS — J301 Allergic rhinitis due to pollen: Secondary | ICD-10-CM | POA: Diagnosis not present

## 2018-09-11 DIAGNOSIS — F419 Anxiety disorder, unspecified: Secondary | ICD-10-CM | POA: Diagnosis not present

## 2018-09-11 DIAGNOSIS — M069 Rheumatoid arthritis, unspecified: Secondary | ICD-10-CM | POA: Diagnosis not present

## 2018-09-15 DIAGNOSIS — R7989 Other specified abnormal findings of blood chemistry: Secondary | ICD-10-CM | POA: Diagnosis not present

## 2018-10-28 ENCOUNTER — Ambulatory Visit: Payer: BC Managed Care – PPO | Admitting: Urology

## 2018-10-28 ENCOUNTER — Ambulatory Visit (INDEPENDENT_AMBULATORY_CARE_PROVIDER_SITE_OTHER): Payer: BC Managed Care – PPO | Admitting: Urology

## 2018-10-28 DIAGNOSIS — R102 Pelvic and perineal pain: Secondary | ICD-10-CM | POA: Diagnosis not present

## 2018-11-03 ENCOUNTER — Ambulatory Visit (INDEPENDENT_AMBULATORY_CARE_PROVIDER_SITE_OTHER): Payer: BC Managed Care – PPO | Admitting: Otolaryngology

## 2018-11-17 ENCOUNTER — Ambulatory Visit (INDEPENDENT_AMBULATORY_CARE_PROVIDER_SITE_OTHER): Payer: BC Managed Care – PPO | Admitting: Otolaryngology

## 2018-12-09 DIAGNOSIS — K2100 Gastro-esophageal reflux disease with esophagitis, without bleeding: Secondary | ICD-10-CM | POA: Diagnosis not present

## 2018-12-09 DIAGNOSIS — Z23 Encounter for immunization: Secondary | ICD-10-CM | POA: Diagnosis not present

## 2018-12-09 DIAGNOSIS — F419 Anxiety disorder, unspecified: Secondary | ICD-10-CM | POA: Diagnosis not present

## 2018-12-09 DIAGNOSIS — M069 Rheumatoid arthritis, unspecified: Secondary | ICD-10-CM | POA: Diagnosis not present

## 2018-12-09 DIAGNOSIS — J301 Allergic rhinitis due to pollen: Secondary | ICD-10-CM | POA: Diagnosis not present

## 2019-01-13 ENCOUNTER — Ambulatory Visit
Admission: EM | Admit: 2019-01-13 | Discharge: 2019-01-13 | Disposition: A | Discharge status: Home / Self Care | Payer: BC Managed Care – PPO | Attending: Emergency Medicine | Admitting: Emergency Medicine

## 2019-01-13 DIAGNOSIS — Z20828 Contact with and (suspected) exposure to other viral communicable diseases: Secondary | ICD-10-CM | POA: Diagnosis not present

## 2019-01-13 DIAGNOSIS — Z20822 Contact with and (suspected) exposure to covid-19: Secondary | ICD-10-CM
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
## 2019-01-13 NOTE — ED Triage Notes (Signed)
Pt here for covid testing after posiitve exposure on the 24th

## 2019-01-13 NOTE — Discharge Instructions (Addendum)

## 2019-01-14 LAB — NOVEL CORONAVIRUS, NAA: SARS-CoV-2, NAA: NOT DETECTED

## 2019-02-17 DIAGNOSIS — M545 Low back pain: Secondary | ICD-10-CM | POA: Diagnosis not present

## 2019-02-17 DIAGNOSIS — M0589 Other rheumatoid arthritis with rheumatoid factor of multiple sites: Secondary | ICD-10-CM | POA: Diagnosis not present

## 2019-02-17 DIAGNOSIS — Z79899 Other long term (current) drug therapy: Secondary | ICD-10-CM | POA: Diagnosis not present

## 2019-02-17 DIAGNOSIS — M2550 Pain in unspecified joint: Secondary | ICD-10-CM | POA: Diagnosis not present

## 2019-03-03 ENCOUNTER — Other Ambulatory Visit: Payer: Self-pay

## 2019-03-03 ENCOUNTER — Encounter: Payer: Self-pay | Admitting: Family Medicine

## 2019-03-03 ENCOUNTER — Ambulatory Visit (INDEPENDENT_AMBULATORY_CARE_PROVIDER_SITE_OTHER): Payer: BC Managed Care – PPO | Admitting: Family Medicine

## 2019-03-03 VITALS — BP 127/81 | HR 64 | Temp 98.2°F | Ht 72.0 in | Wt 213.0 lb

## 2019-03-03 DIAGNOSIS — F419 Anxiety disorder, unspecified: Secondary | ICD-10-CM | POA: Diagnosis not present

## 2019-03-03 DIAGNOSIS — F17200 Nicotine dependence, unspecified, uncomplicated: Secondary | ICD-10-CM

## 2019-03-03 DIAGNOSIS — R739 Hyperglycemia, unspecified: Secondary | ICD-10-CM | POA: Diagnosis not present

## 2019-03-03 NOTE — Progress Notes (Signed)
New Patient Office Visit  Subjective:  Patient ID: Gary Woods, male    DOB: July 09, 1970  Age: 49 y.o. MRN: [id number] Total Private Pay: 0   Fill Date ID   Written Drug Qty Days Prescriber Rx # Pharmacy Refill   Daily Dose* Pymt Type PMP    02/05/2019  1   09/30/2018  Alprazolam 1 MG Tablet  120.00  30 Ed Haw   [id number]   [facility] ([id number])   4  8.00 LME  Comm Ins   [place]  01/05/2019  1   09/30/2018  Alprazolam 1 MG Tablet  120.00  30 Ed Haw   [id number]   Rei ([id number])   3  8.00 LME  Comm Ins   [place]  12/06/2018  1   09/30/2018  Alprazolam 1 MG Tablet  120.00  30 Ed Haw   [id number]   Rei ([id number])   2  8.00 LME  Comm Ins   [place]  10/30/2018  1   09/30/2018  Alprazolam 1 MG Tablet  120.00  30 Ed Haw   [id number]   Rei ([id number])   1  8.00 LME  Comm Ins   [place]  10/01/2018  1   09/30/2018  Alprazolam 1 MG Tablet  120.00  30 Ed Haw   [id number]   Rei ([id number])   0  8.00 LME  Comm Ins   [place]  09/01/2018  1   04/28/2018  Alprazolam 1 MG Tablet  120.00  30 Ed Haw   [id number]   Rei ([id number])   4  8.00 LME  Comm Ins   [place]  07/28/2018  1   04/28/2018  Alprazolam 1 MG Tablet  120.00  30 Ed Haw   [id number]   Rei ([id number])   3  8.00 LME  Comm Ins   [place]  06/27/2018  1   04/28/2018  Alprazolam 1 MG Tablet  120.00  30 Ed Haw   [id number]   Rei ([id number])   2  8.00 LME  Comm Ins   [place]  05/27/2018  1   04/28/2018  Alprazolam 1 MG Tablet  120.00  30 Ed Haw   [id number]   Rei ([id number])   1  8.00 LME  Comm Ins   [place]  04/28/2018  1   04/28/2018  Alprazolam 1 MG Tablet  120.00  30 Ed Haw   [id number]   Rei ([id number])   0  8.00 LME  Comm Ins   [place]  03/27/2018  1   11/25/2017  Alprazolam 1 MG Tablet  120.00  30 Ed Haw   [id number]   Rei ([id number])   4  8.00 LME  Comm Ins   [place]  02/27/2018  1   11/25/2017  Alprazolam 1 MG Tablet  120.00  30 Ed Haw   [id number]   Rei ([id number])   3  8.00 LME  Comm Ins   [place]  01/27/2018  1   11/25/2017  Alprazolam 1 MG Tablet  120.00  30 Ed Haw   [id number]   Rei ([id number])   2  8.00 LME  Comm Ins   [place]  12/28/2017  1   11/25/2017  Alprazolam 1 MG Tablet  120.00  30 Ed Haw   [id number]    Rei ([id number])   1  8.00 LME  Comm Ins   [place]  11/25/2017  1   11/25/2017  Alprazolam 1 MG Tablet  120.00  30 Ed Haw   [id number]   Rei [phone])  0  8.00 LME  Comm Ins   [place]  10/26/2017  1   05/27/2017  Alprazolam 1 MG Tablet  120.00  30 Ed Haw   [id number]   Rei ([id number])   5  8.00 LME  Comm Ins   [place]  09/26/2017  1   05/27/2017  Alprazolam 1 MG Tablet  120.00  30 Ed Haw   [id number]   Rei ([id number])   4  8.00 LME  Comm Ins   [place]  08/26/2017  1   05/27/2017  Alprazolam 1 MG Tablet  120.00  30 Ed Haw   [id number]   Rei ([id number])   3  8.00 LME  Comm Ins   [place]  07/26/2017  1   05/27/2017  Alprazolam 1 MG Tablet  120.00  30 Ed Haw   [id number]   Rei ([id number])   2  8.00 LME  Comm Ins   [place]  06/27/2017  1   05/27/2017  Alprazolam 1 MG Tablet  120.00  30 Ed Haw   [id number]   Rei ([id number])   1  8.00 LME  Comm Ins   [place]  05/27/2017  1   05/27/2017  Alprazolam 1 MG Tablet  120.00  30 Ed Haw   [id number]   Rei ([id number])   0  8.00 LME  Comm Ins   [place]  04/27/2017  1   11/26/2016  Alprazolam 1 MG Tablet              CC:  Chief Complaint  Patient presents with  . Establish Care  RA/GERD/Tinnitus/anxiety  HPI Gary Woods presents for COPD-mild-no medications-tob 1 pk/day(was smoking 2pk/day) [facility] GERDPantoprozole started with improved symptoms(previously used omeprazole-helped but not resolved) Tinnitus/fluid in the middle ear-left ear-PE placed Anxiety-Paxil-daily xanax QID Past Medical History:  Diagnosis Date  . Anxiety   . Collagen vascular disease ([place])   . COPD (chronic obstructive pulmonary disease) ([place])   . Depression   . Rheumatoid arthritis ([place])     Family History  Problem Relation Age of Onset  . CAD Neg Hx     Social History   Socioeconomic History  . Marital status: Divorced    Spouse name: Not on file  . Number of children: Not on file  . Years of education: Not on file  . Highest education level: Not on file  Occupational History  . Occupation: Architect  Tobacco  Use  . Smoking status: Current Every Day Smoker    Packs/day: 1.00    Years: 25.00    Pack years: 25.00    Types: Cigarettes  . Smokeless tobacco: Never Used  Substance and Sexual Activity  . Alcohol use: No  . Drug use: No    Types: Marijuana  . Sexual activity: Yes  Other Topics Concern  . Not on file  Social History Narrative  . Not on file   Social Determinants of Health   Financial Resource Strain:   . Difficulty of Paying Living Expenses: Not on file  Food Insecurity:   . Worried About Charity fundraiser in the Last Year: Not on file  . Ran Out of Food in the Last Year: Not on file  Transportation Needs:   . Lack of Transportation (Medical): Not on file  . Lack of Transportation (Non-Medical): Not on file  Physical Activity:   . Days of Exercise per Week: Not on file  . Minutes of Exercise per Session: Not on file  Stress:   .  Feeling of Stress : Not on file  Social Connections:   . Frequency of Communication with Friends and Family: Not on file  . Frequency of Social Gatherings with Friends and Family: Not on file  . Attends Religious Services: Not on file  . Active Member of Clubs or Organizations: Not on file  . Attends Archivist Meetings: Not on file  . Marital Status: Not on file  Intimate Partner Violence:   . Fear of Current or Ex-Partner: Not on file  . Emotionally Abused: Not on file  . Physically Abused: Not on file  . Sexually Abused: Not on file    ROS Review of Systems  Constitutional: Positive for diaphoresis and fatigue.  HENT: Positive for congestion, ear pain, hearing loss, rhinorrhea, sinus pressure, sinus pain and sneezing.   Respiratory: Positive for cough, chest tightness and wheezing.   Cardiovascular: Positive for chest pain.  Gastrointestinal: Positive for constipation.  Genitourinary:       GERD  Musculoskeletal: Positive for arthralgias, back pain, joint swelling, myalgias, neck pain and neck stiffness.   Allergic/Immunologic: Positive for environmental allergies.  Neurological: Positive for dizziness, light-headedness, numbness and headaches.  Psychiatric/Behavioral: Positive for agitation, decreased concentration and dysphoric mood. The patient is nervous/anxious and is hyperactive.        Difficulty social situation -wife left him-increase responsibility    Objective:   Today's Vitals: BP 127/81 (BP Location: Left Arm, Patient Position: Sitting)   Pulse 64   Temp 98.2 F (36.8 C) (Oral)   Ht 6' (1.829 m)   Wt 213 lb (96.6 kg)   SpO2 97%   BMI 28.89 kg/m   Physical Exam Vitals reviewed.  Constitutional:      Appearance: Normal appearance.  HENT:     Head: Normocephalic and atraumatic.  Cardiovascular:     Rate and Rhythm: Normal rate and regular rhythm.     Pulses: Normal pulses.     Heart sounds: Normal heart sounds.  Pulmonary:     Effort: Pulmonary effort is normal.     Breath sounds: Normal breath sounds.  Musculoskeletal:     Cervical back: Normal range of motion and neck supple.     Right lower leg: Edema present.  Neurological:     Mental Status: He is alert and oriented to person, place, and time.  Psychiatric:        Mood and Affect: Mood normal.        Behavior: Behavior normal.     Assessment & Plan:   1. Anxiety Took 120 Xanax /month-.  anxiety - Ambulatory referral to Psychiatry Concern for large amount of xanax being used for anxiety. Reviewed old notes-concern for bipolar diagnosis-pt agreed to talk with someone about extensive use for anxiety Concern for large amounts of medication that is highly addictive.  Discussed with pt I can not prescribe his Xanax and this medication will be difficult to stop. Pt understands and agrees to referral to psy evaluation with diagnosis and treatment.  Reviewed pts notes from Dr. Luan Pulling.  Referral made by Dr. Luan Pulling in November for psy evaluation with concern for diagnosis and treatment.  2 Tobacco use  disorder encouraged to quit Outpatient Encounter Medications as of 03/03/2019  Medication Sig  . ALPRAZolam (XANAX) 1 MG tablet Take 1 tablet by mouth 3 (three) times daily.  . fluticasone (FLONASE) 50 MCG/ACT nasal spray Place into both nostrils daily.  Marland Kitchen HUMIRA PEN 40 MG/0.8ML PNKT Inject 0.8 mLs into the muscle once a week.  Marland Kitchen  meloxicam (MOBIC) 15 MG tablet Take 15 mg by mouth daily.  . pantoprazole (PROTONIX) 40 MG tablet Take 40 mg by mouth daily.  Marland Kitchen PARoxetine (PAXIL) 20 MG tablet Take 20 mg by mouth daily.  . [DISCONTINUED] azithromycin (ZITHROMAX Z-PAK) 250 MG tablet 2 po day one, then 1 daily x 4 days  . [DISCONTINUED] guaiFENesin-codeine 100-10 MG/5ML syrup Take 10 mLs by mouth every 6 (six) hours as needed for cough.  . [DISCONTINUED] omeprazole (PRILOSEC) 20 MG capsule Take 1 capsule by mouth daily.   No facility-administered encounter medications on file as of 03/03/2019.   Reviewed old records, discussed with pt his history, treatment , physical , assessment and plan Follow-up: fasting labwork.    Magdelyn Roebuck Hannah Beat, MD

## 2019-03-03 NOTE — Patient Instructions (Addendum)
Lipid panel when other labs drawn for RA-bring copy of labwork  psy referral  COVID-19 Vaccine Information can be found at: ShippingScam.co.uk For questions related to vaccine distribution or appointments, please email [email or web address] or call [phone].

## 2019-03-07 DIAGNOSIS — F419 Anxiety disorder, unspecified: Secondary | ICD-10-CM | POA: Insufficient documentation

## 2019-04-04 ENCOUNTER — Ambulatory Visit
Admission: EM | Admit: 2019-04-04 | Discharge: 2019-04-04 | Disposition: A | Discharge status: Home / Self Care | Payer: BC Managed Care – PPO | Attending: Emergency Medicine | Admitting: Emergency Medicine

## 2019-04-04 ENCOUNTER — Other Ambulatory Visit: Payer: Self-pay

## 2019-04-04 DIAGNOSIS — Z20822 Contact with and (suspected) exposure to covid-19: Secondary | ICD-10-CM | POA: Diagnosis not present

## 2019-04-04 DIAGNOSIS — J069 Acute upper respiratory infection, unspecified: Secondary | ICD-10-CM

## 2019-04-04 MED ORDER — BENZONATATE 100 MG PO CAPS: 100.0000 mg | ORAL_CAPSULE | Freq: Three times a day (TID) | ORAL | 0 refills | Status: DC

## 2019-04-04 MED ORDER — HYDROCODONE-HOMATROPINE 5-1.5 MG/5ML PO SYRP: 5.0000 mL | ORAL_SOLUTION | Freq: Every day | ORAL | 0 refills | Status: DC

## 2019-04-04 NOTE — ED Provider Notes (Signed)
[facility]   [id number] 04/04/19 Arrival Time: [id number]   CC: COVID symptoms  SUBJECTIVE: History from: patient.  Gary Woods is a 49 y.o. male who presents with fatigue, burning in nose, throat, and chest, PND, and nonproductive cough x 3 days.  Denies sick exposure to COVID, flu or strep.  Denies recent travel.  Has tried OTC medications with minimal relief.  Symptoms are made worse at night.  Reports previous symptoms in the past with bronchitis.   Denies fever, chills, SOB, wheezing, chest pain, nausea, vomiting, changes in bowel or bladder habits.    ROS: As per HPI.  All other pertinent ROS negative.     Past Medical History:  Diagnosis Date  . Anxiety   . Collagen vascular disease ([place])   . COPD (chronic obstructive pulmonary disease) ([place])   . Depression   . Rheumatoid arthritis ([place])    History reviewed. No pertinent surgical history. No Known Allergies No current facility-administered medications on file prior to encounter.   Current Outpatient Medications on File Prior to Encounter  Medication Sig Dispense Refill  . ALPRAZolam (XANAX) 1 MG tablet Take 1 tablet by mouth 3 (three) times daily.  4  . fluticasone (FLONASE) 50 MCG/ACT nasal spray Place into both nostrils daily.    Marland Kitchen HUMIRA PEN 40 MG/0.8ML PNKT Inject 0.8 mLs into the muscle once a week.  0  . meloxicam (MOBIC) 15 MG tablet Take 15 mg by mouth daily.    . pantoprazole (PROTONIX) 40 MG tablet Take 40 mg by mouth daily.    Marland Kitchen PARoxetine (PAXIL) 20 MG tablet Take 20 mg by mouth daily.     Social History   Socioeconomic History  . Marital status: Divorced    Spouse name: Not on file  . Number of children: Not on file  . Years of education: Not on file  . Highest education level: Not on file  Occupational History  . Occupation: Architect  Tobacco Use  . Smoking status: Current Every Day Smoker    Packs/day: 1.00    Years: 25.00    Pack years: 25.00    Types: Cigarettes  . Smokeless  tobacco: Never Used  Substance and Sexual Activity  . Alcohol use: No  . Drug use: No    Types: Marijuana  . Sexual activity: Yes  Other Topics Concern  . Not on file  Social History Narrative  . Not on file   Social Determinants of Health   Financial Resource Strain:   . Difficulty of Paying Living Expenses:   Food Insecurity:   . Worried About Charity fundraiser in the Last Year:   . Arboriculturist in the Last Year:   Transportation Needs:   . Film/video editor (Medical):   Marland Kitchen Lack of Transportation (Non-Medical):   Physical Activity:   . Days of Exercise per Week:   . Minutes of Exercise per Session:   Stress:   . Feeling of Stress :   Social Connections:   . Frequency of Communication with Friends and Family:   . Frequency of Social Gatherings with Friends and Family:   . Attends Religious Services:   . Active Member of Clubs or Organizations:   . Attends Archivist Meetings:   Marland Kitchen Marital Status:   Intimate Partner Violence:   . Fear of Current or Ex-Partner:   . Emotionally Abused:   Marland Kitchen Physically Abused:   . Sexually Abused:    Family History  Problem Relation Age of Onset  . CAD Neg Hx     OBJECTIVE:  Vitals:   04/04/19 0920  BP: 131/85  Pulse: 75  Resp: 18  Temp: 98.2 F (36.8 C)  SpO2: 95%     General appearance: alert; appears mildly fatigued, but nontoxic; speaking in full sentences and tolerating own secretions HEENT: NCAT; Ears: EACs clear, TMs pearly gray; Eyes: PERRL.  EOM grossly intact. Nose: nares patent without rhinorrhea, Throat: oropharynx clear, tonsils non erythematous or enlarged, uvula midline  Neck: supple without LAD Lungs: unlabored respirations, symmetrical air entry; cough: moderate; no respiratory distress; CTAB Heart: regular rate and rhythm.  Radial pulses 2+ symmetrical bilaterally Skin: warm and dry Psychological: alert and cooperative; normal mood and affect  ASSESSMENT & PLAN:  1. Suspected COVID-19  virus infection   2. Viral URI with cough     Meds ordered this encounter  Medications  . benzonatate (TESSALON) 100 MG capsule    Sig: Take 1 capsule (100 mg total) by mouth every 8 (eight) hours.    Dispense:  21 capsule    Refill:  0    Order Specific Question:   Supervising Provider    Answer:   Raylene Everts [id number]  . HYDROcodone-homatropine (HYCODAN) 5-1.5 MG/5ML syrup    Sig: Take 5 mLs by mouth at bedtime.    Dispense:  25 mL    Refill:  0    Order Specific Question:   Supervising Provider    Answer:   Raylene Everts [id number]   COVID testing ordered.  It will take between 2-5 days for test results.  Someone will contact you regarding abnormal results.    In the meantime: You should remain isolated in your home for 10 days from symptom onset AND greater than 72 hours after symptoms resolution (absence of fever without the use of fever-reducing medication and improvement in respiratory symptoms), whichever is longer Get plenty of rest and push fluids Tessalon Perles prescribed for cough Hycodan prescription given for severe break-through cough at night.  DO NOT TAKE prior to driving or operating heavy machinery Use OTC zyrtec for nasal congestion, runny nose, and/or sore throat Use OTC flonase for nasal congestion and runny nose Use medications daily for symptom relief Use OTC medications like ibuprofen or tylenol as needed fever or pain Call or go to the ED if you have any new or worsening symptoms such as fever, worsening cough, shortness of breath, chest tightness, chest pain, turning blue, changes in mental status, etc...    Reviewed expectations re: course of current medical issues. Questions answered. Outlined signs and symptoms indicating need for more acute intervention. Patient verbalized understanding. After Visit Summary given.         Stacey Drain [place], PA-C 04/04/19 [phone]

## 2019-04-04 NOTE — ED Triage Notes (Signed)
Pt presents with  C/o cough for past 3 nights and has been  Unable to sleep because of cough

## 2019-04-04 NOTE — Discharge Instructions (Addendum)
COVID testing ordered.  It will take between 2-5 days for test results.  Someone will contact you regarding abnormal results.    In the meantime: You should remain isolated in your home for 10 days from symptom onset AND greater than 72 hours after symptoms resolution (absence of fever without the use of fever-reducing medication and improvement in respiratory symptoms), whichever is longer Get plenty of rest and push fluids Tessalon Perles prescribed for cough Hycodan prescription given for severe break-through cough at night.  DO NOT TAKE prior to driving or operating heavy machinery Use OTC zyrtec for nasal congestion, runny nose, and/or sore throat Use OTC flonase for nasal congestion and runny nose Use medications daily for symptom relief Use OTC medications like ibuprofen or tylenol as needed fever or pain Call or go to the ED if you have any new or worsening symptoms such as fever, worsening cough, shortness of breath, chest tightness, chest pain, turning blue, changes in mental status, etc..Marland Kitchen

## 2019-04-05 LAB — NOVEL CORONAVIRUS, NAA: SARS-CoV-2, NAA: NOT DETECTED

## 2019-05-13 DIAGNOSIS — H9042 Sensorineural hearing loss, unilateral, left ear, with unrestricted hearing on the contralateral side: Secondary | ICD-10-CM | POA: Diagnosis not present

## 2019-05-13 DIAGNOSIS — H6123 Impacted cerumen, bilateral: Secondary | ICD-10-CM | POA: Diagnosis not present

## 2019-05-13 DIAGNOSIS — H9312 Tinnitus, left ear: Secondary | ICD-10-CM | POA: Diagnosis not present

## 2019-05-22 DIAGNOSIS — Z716 Tobacco abuse counseling: Secondary | ICD-10-CM | POA: Diagnosis not present

## 2019-05-22 DIAGNOSIS — M069 Rheumatoid arthritis, unspecified: Secondary | ICD-10-CM | POA: Diagnosis not present

## 2019-05-22 DIAGNOSIS — F332 Major depressive disorder, recurrent severe without psychotic features: Secondary | ICD-10-CM | POA: Diagnosis not present

## 2019-05-22 DIAGNOSIS — Z0189 Encounter for other specified special examinations: Secondary | ICD-10-CM | POA: Diagnosis not present

## 2019-06-04 ENCOUNTER — Encounter: Payer: Self-pay | Admitting: Emergency Medicine

## 2019-06-04 ENCOUNTER — Other Ambulatory Visit: Payer: Self-pay

## 2019-06-04 ENCOUNTER — Ambulatory Visit
Admission: EM | Admit: 2019-06-04 | Discharge: 2019-06-04 | Disposition: A | Discharge status: Home / Self Care | Payer: BC Managed Care – PPO | Attending: Emergency Medicine | Admitting: Emergency Medicine

## 2019-06-04 DIAGNOSIS — M5442 Lumbago with sciatica, left side: Secondary | ICD-10-CM

## 2019-06-04 MED ORDER — PREDNISONE 10 MG (21) PO TBPK: ORAL_TABLET | Freq: Every day | ORAL | 0 refills | Status: DC

## 2019-06-04 MED ORDER — DEXAMETHASONE SODIUM PHOSPHATE 10 MG/ML IJ SOLN
10.0000 mg | Freq: Once | INTRAMUSCULAR | Status: AC
  Administered 2019-06-04: 10 mg via INTRAVENOUS

## 2019-06-04 MED ORDER — CYCLOBENZAPRINE HCL 10 MG PO TABS: 10.0000 mg | ORAL_TABLET | Freq: Every day | ORAL | 0 refills | Status: DC

## 2019-06-04 MED ORDER — KETOROLAC TROMETHAMINE 60 MG/2ML IM SOLN
60.0000 mg | Freq: Once | INTRAMUSCULAR | Status: AC
  Administered 2019-06-04: 60 mg via INTRAMUSCULAR

## 2019-06-04 NOTE — ED Triage Notes (Signed)
Pt sts lower back pain with radiation to left leg after bending over spraying a hose; pt sts hx of bulging disc in past

## 2019-06-04 NOTE — ED Provider Notes (Signed)
[name]   [id number] 06/04/19 Arrival Time: 1303  [id number] PAIN  SUBJECTIVE: History from: patient. Gary Woods is a 49 y.o. male complains of LT low back pain radiating into LT leg x 1 day.  Was hosing off the driveway when symptoms began.  Localizes the pain to the LT low back.  Describes the pain as constant and sharp in character.  Has tried OTC medications without relief.  Symptoms are made worse with sitting on his LT side.  Reports similar symptoms in the past.  Hx significant for herniated disc and RA.  Denies fever, chills, erythema, ecchymosis, effusion, weakness, numbness and tingling, saddle paresthesias, loss of bowel or bladder function.      ROS: As per HPI.  All other pertinent ROS negative.     Past Medical History:  Diagnosis Date  . Anxiety   . Collagen vascular disease ([place])   . COPD (chronic obstructive pulmonary disease) ([place])   . Depression   . Rheumatoid arthritis ([place])    History reviewed. No pertinent surgical history. No Known Allergies No current facility-administered medications on file prior to encounter.   Current Outpatient Medications on File Prior to Encounter  Medication Sig Dispense Refill  . ALPRAZolam (XANAX) 1 MG tablet Take 1 tablet by mouth 3 (three) times daily.  4  . benzonatate (TESSALON) 100 MG capsule Take 1 capsule (100 mg total) by mouth every 8 (eight) hours. 21 capsule 0  . fluticasone (FLONASE) 50 MCG/ACT nasal spray Place into both nostrils daily.    Marland Kitchen HUMIRA PEN 40 MG/0.8ML PNKT Inject 0.8 mLs into the muscle once a week.  0  . HYDROcodone-homatropine (HYCODAN) 5-1.5 MG/5ML syrup Take 5 mLs by mouth at bedtime. 25 mL 0  . meloxicam (MOBIC) 15 MG tablet Take 15 mg by mouth daily.    . pantoprazole (PROTONIX) 40 MG tablet Take 40 mg by mouth daily.    Marland Kitchen PARoxetine (PAXIL) 20 MG tablet Take 20 mg by mouth daily.     Social History   Socioeconomic History  . Marital status: Divorced    Spouse name: Not on file   . Number of children: Not on file  . Years of education: Not on file  . Highest education level: Not on file  Occupational History  . Occupation: Architect  Tobacco Use  . Smoking status: Current Every Day Smoker    Packs/day: 1.00    Years: 25.00    Pack years: 25.00    Types: Cigarettes  . Smokeless tobacco: Never Used  Substance and Sexual Activity  . Alcohol use: No  . Drug use: No    Types: Marijuana  . Sexual activity: Yes  Other Topics Concern  . Not on file  Social History Narrative  . Not on file   Social Determinants of Health   Financial Resource Strain:   . Difficulty of Paying Living Expenses:   Food Insecurity:   . Worried About Charity fundraiser in the Last Year:   . Arboriculturist in the Last Year:   Transportation Needs:   . Film/video editor (Medical):   Marland Kitchen Lack of Transportation (Non-Medical):   Physical Activity:   . Days of Exercise per Week:   . Minutes of Exercise per Session:   Stress:   . Feeling of Stress :   Social Connections:   . Frequency of Communication with Friends and Family:   . Frequency of Social Gatherings with Friends and Family:   .  Attends Religious Services:   . Active Member of Clubs or Organizations:   . Attends Archivist Meetings:   Marland Kitchen Marital Status:   Intimate Partner Violence:   . Fear of Current or Ex-Partner:   . Emotionally Abused:   Marland Kitchen Physically Abused:   . Sexually Abused:    Family History  Problem Relation Age of Onset  . CAD Neg Hx     OBJECTIVE:  Vitals:   06/04/19 1309  BP: 128/69  Pulse: 78  Resp: 18  Temp: 98.8 F (37.1 C)  TempSrc: Tympanic  SpO2: 97%    General appearance: ALERT; appears uncomfortable, but nontoxic Head: NCAT Lungs: Normal respiratory effort Musculoskeletal: Back Inspection: Skin warm, dry, clear and intact without obvious erythema, effusion, or ecchymosis.  Palpation: TTP over LT low back ROM: FROM active and passive Strength: 5/5 shld  abduction, 5/5 shld adduction, 5/5 elbow flexion, 5/5 elbow extension, 5/5 grip strength, 5/5 hip flexion, 5/5 hip extension Skin: warm and dry Neurologic: Ambulates without difficulty; Sensation intact about the upper/ lower extremities Psychological: alert and cooperative; normal mood and affect   ASSESSMENT & PLAN:  1. Acute left-sided low back pain with left-sided sciatica     Meds ordered this encounter  Medications  . predniSONE (STERAPRED UNI-PAK 21 TAB) 10 MG (21) TBPK tablet    Sig: Take by mouth daily. Take 6 tabs by mouth daily  for 2 days, then 5 tabs for 2 days, then 4 tabs for 2 days, then 3 tabs for 2 days, 2 tabs for 2 days, then 1 tab by mouth daily for 2 days    Dispense:  42 tablet    Refill:  0    Order Specific Question:   Supervising Provider    Answer:   Raylene Everts [id number]  . cyclobenzaprine (FLEXERIL) 10 MG tablet    Sig: Take 1 tablet (10 mg total) by mouth at bedtime.    Dispense:  15 tablet    Refill:  0    Order Specific Question:   Supervising Provider    Answer:   Raylene Everts [id number]   Toradol and decadron shots given Continue conservative management of rest, ice, and gentle stretches Prednisone taper prescribed.  Take as directed and to completion Take cyclobenzaprine at nighttime for symptomatic relief. Avoid driving or operating heavy machinery while using medication. Follow up with PCP if symptoms persist Return or go to the ER if you have any new or worsening symptoms (fever, chills, chest pain, abdominal pain, changes in bowel or bladder habits, pain radiating into lower legs, etc...)   Reviewed expectations re: course of current medical issues. Questions answered. Outlined signs and symptoms indicating need for more acute intervention. Patient verbalized understanding. After Visit Summary given.    Lestine Box, PA-C 06/04/19 1333

## 2019-06-04 NOTE — Discharge Instructions (Signed)
Toradol and decadron shots given Continue conservative management of rest, ice, and gentle stretches Prednisone taper prescribed.  Take as directed and to completion Take cyclobenzaprine at nighttime for symptomatic relief. Avoid driving or operating heavy machinery while using medication. Follow up with PCP if symptoms persist Return or go to the ER if you have any new or worsening symptoms (fever, chills, chest pain, abdominal pain, changes in bowel or bladder habits, pain radiating into lower legs, etc...)

## 2019-06-05 DIAGNOSIS — E785 Hyperlipidemia, unspecified: Secondary | ICD-10-CM | POA: Diagnosis not present

## 2019-06-05 DIAGNOSIS — Z0000 Encounter for general adult medical examination without abnormal findings: Secondary | ICD-10-CM | POA: Diagnosis not present

## 2019-06-10 ENCOUNTER — Other Ambulatory Visit (HOSPITAL_COMMUNITY): Payer: Self-pay | Admitting: Internal Medicine

## 2019-06-10 ENCOUNTER — Other Ambulatory Visit: Payer: Self-pay

## 2019-06-10 ENCOUNTER — Ambulatory Visit (HOSPITAL_COMMUNITY)
Admission: RE | Admit: 2019-06-10 | Discharge: 2019-06-10 | Disposition: A | Discharge status: Home / Self Care | Payer: BC Managed Care – PPO | Source: Ambulatory Visit | Attending: Internal Medicine | Admitting: Internal Medicine

## 2019-06-10 DIAGNOSIS — M545 Low back pain, unspecified: Secondary | ICD-10-CM

## 2019-06-10 DIAGNOSIS — F332 Major depressive disorder, recurrent severe without psychotic features: Secondary | ICD-10-CM | POA: Diagnosis not present

## 2019-06-10 DIAGNOSIS — Z716 Tobacco abuse counseling: Secondary | ICD-10-CM | POA: Diagnosis not present

## 2019-06-10 DIAGNOSIS — M069 Rheumatoid arthritis, unspecified: Secondary | ICD-10-CM | POA: Diagnosis not present

## 2019-06-10 DIAGNOSIS — Z0189 Encounter for other specified special examinations: Secondary | ICD-10-CM | POA: Diagnosis not present

## 2019-06-16 ENCOUNTER — Other Ambulatory Visit: Payer: Self-pay | Admitting: Internal Medicine

## 2019-06-16 ENCOUNTER — Other Ambulatory Visit (HOSPITAL_COMMUNITY): Payer: Self-pay | Admitting: Internal Medicine

## 2019-06-16 DIAGNOSIS — M545 Low back pain, unspecified: Secondary | ICD-10-CM

## 2019-06-22 DIAGNOSIS — H9192 Unspecified hearing loss, left ear: Secondary | ICD-10-CM | POA: Diagnosis not present

## 2019-07-01 DIAGNOSIS — Z79899 Other long term (current) drug therapy: Secondary | ICD-10-CM | POA: Diagnosis not present

## 2019-07-01 DIAGNOSIS — M545 Low back pain: Secondary | ICD-10-CM | POA: Diagnosis not present

## 2019-07-01 DIAGNOSIS — M2550 Pain in unspecified joint: Secondary | ICD-10-CM | POA: Diagnosis not present

## 2019-07-01 DIAGNOSIS — M0589 Other rheumatoid arthritis with rheumatoid factor of multiple sites: Secondary | ICD-10-CM | POA: Diagnosis not present

## 2019-07-01 DIAGNOSIS — L03011 Cellulitis of right finger: Secondary | ICD-10-CM | POA: Diagnosis not present

## 2019-07-06 ENCOUNTER — Ambulatory Visit (HOSPITAL_COMMUNITY): Payer: BC Managed Care – PPO

## 2019-07-14 ENCOUNTER — Encounter (HOSPITAL_COMMUNITY): Payer: Self-pay

## 2019-07-14 ENCOUNTER — Ambulatory Visit (HOSPITAL_COMMUNITY): Payer: BC Managed Care – PPO

## 2019-07-15 ENCOUNTER — Other Ambulatory Visit: Payer: Self-pay | Admitting: Internal Medicine

## 2019-07-15 DIAGNOSIS — Z6828 Body mass index (BMI) 28.0-28.9, adult: Secondary | ICD-10-CM | POA: Diagnosis not present

## 2019-07-15 DIAGNOSIS — M5416 Radiculopathy, lumbar region: Secondary | ICD-10-CM | POA: Diagnosis not present

## 2019-07-15 DIAGNOSIS — M545 Low back pain, unspecified: Secondary | ICD-10-CM

## 2019-07-15 DIAGNOSIS — R030 Elevated blood-pressure reading, without diagnosis of hypertension: Secondary | ICD-10-CM | POA: Diagnosis not present

## 2019-07-28 DIAGNOSIS — L03011 Cellulitis of right finger: Secondary | ICD-10-CM | POA: Diagnosis not present

## 2019-08-02 ENCOUNTER — Ambulatory Visit
Admission: RE | Admit: 2019-08-02 | Discharge: 2019-08-02 | Disposition: A | Discharge status: Home / Self Care | Payer: BC Managed Care – PPO | Source: Ambulatory Visit | Attending: Internal Medicine | Admitting: Internal Medicine

## 2019-08-02 ENCOUNTER — Other Ambulatory Visit: Payer: Self-pay

## 2019-08-02 DIAGNOSIS — M545 Low back pain, unspecified: Secondary | ICD-10-CM

## 2019-08-02 DIAGNOSIS — M48061 Spinal stenosis, lumbar region without neurogenic claudication: Secondary | ICD-10-CM | POA: Diagnosis not present

## 2019-08-06 DIAGNOSIS — L03011 Cellulitis of right finger: Secondary | ICD-10-CM | POA: Diagnosis not present

## 2019-08-14 DIAGNOSIS — M5416 Radiculopathy, lumbar region: Secondary | ICD-10-CM | POA: Diagnosis not present

## 2019-08-14 DIAGNOSIS — M545 Low back pain: Secondary | ICD-10-CM | POA: Diagnosis not present

## 2019-08-14 DIAGNOSIS — M5126 Other intervertebral disc displacement, lumbar region: Secondary | ICD-10-CM | POA: Diagnosis not present

## 2019-08-14 DIAGNOSIS — M48061 Spinal stenosis, lumbar region without neurogenic claudication: Secondary | ICD-10-CM | POA: Diagnosis not present

## 2019-08-20 DIAGNOSIS — M4726 Other spondylosis with radiculopathy, lumbar region: Secondary | ICD-10-CM | POA: Diagnosis not present

## 2019-08-20 DIAGNOSIS — M48062 Spinal stenosis, lumbar region with neurogenic claudication: Secondary | ICD-10-CM | POA: Diagnosis not present

## 2019-08-20 DIAGNOSIS — M5116 Intervertebral disc disorders with radiculopathy, lumbar region: Secondary | ICD-10-CM | POA: Diagnosis not present

## 2019-08-20 DIAGNOSIS — M47816 Spondylosis without myelopathy or radiculopathy, lumbar region: Secondary | ICD-10-CM | POA: Diagnosis not present

## 2019-08-20 DIAGNOSIS — M50221 Other cervical disc displacement at C4-C5 level: Secondary | ICD-10-CM | POA: Diagnosis not present

## 2019-08-20 DIAGNOSIS — M48061 Spinal stenosis, lumbar region without neurogenic claudication: Secondary | ICD-10-CM | POA: Diagnosis not present

## 2019-08-31 ENCOUNTER — Ambulatory Visit: Payer: BC Managed Care – PPO | Admitting: Family Medicine

## 2019-09-02 DIAGNOSIS — Z79899 Other long term (current) drug therapy: Secondary | ICD-10-CM | POA: Diagnosis not present

## 2019-09-02 DIAGNOSIS — Z111 Encounter for screening for respiratory tuberculosis: Secondary | ICD-10-CM | POA: Diagnosis not present

## 2019-09-02 DIAGNOSIS — M545 Low back pain: Secondary | ICD-10-CM | POA: Diagnosis not present

## 2019-09-02 DIAGNOSIS — M2550 Pain in unspecified joint: Secondary | ICD-10-CM | POA: Diagnosis not present

## 2019-09-02 DIAGNOSIS — M0589 Other rheumatoid arthritis with rheumatoid factor of multiple sites: Secondary | ICD-10-CM | POA: Diagnosis not present

## 2019-09-18 DIAGNOSIS — E782 Mixed hyperlipidemia: Secondary | ICD-10-CM | POA: Diagnosis not present

## 2019-09-18 DIAGNOSIS — L03011 Cellulitis of right finger: Secondary | ICD-10-CM | POA: Diagnosis not present

## 2019-09-23 DIAGNOSIS — M069 Rheumatoid arthritis, unspecified: Secondary | ICD-10-CM | POA: Diagnosis not present

## 2019-09-23 DIAGNOSIS — F332 Major depressive disorder, recurrent severe without psychotic features: Secondary | ICD-10-CM | POA: Diagnosis not present

## 2019-09-23 DIAGNOSIS — Z716 Tobacco abuse counseling: Secondary | ICD-10-CM | POA: Diagnosis not present

## 2019-09-23 DIAGNOSIS — F17200 Nicotine dependence, unspecified, uncomplicated: Secondary | ICD-10-CM | POA: Diagnosis not present

## 2019-11-26 DIAGNOSIS — J019 Acute sinusitis, unspecified: Secondary | ICD-10-CM | POA: Diagnosis not present

## 2019-11-27 DIAGNOSIS — J019 Acute sinusitis, unspecified: Secondary | ICD-10-CM | POA: Diagnosis not present

## 2019-12-17 DIAGNOSIS — H9312 Tinnitus, left ear: Secondary | ICD-10-CM | POA: Diagnosis not present

## 2019-12-17 DIAGNOSIS — H903 Sensorineural hearing loss, bilateral: Secondary | ICD-10-CM | POA: Diagnosis not present

## 2019-12-17 DIAGNOSIS — H6123 Impacted cerumen, bilateral: Secondary | ICD-10-CM | POA: Diagnosis not present

## 2020-01-04 DIAGNOSIS — Z716 Tobacco abuse counseling: Secondary | ICD-10-CM | POA: Diagnosis not present

## 2020-01-04 DIAGNOSIS — F332 Major depressive disorder, recurrent severe without psychotic features: Secondary | ICD-10-CM | POA: Diagnosis not present

## 2020-01-04 DIAGNOSIS — F17200 Nicotine dependence, unspecified, uncomplicated: Secondary | ICD-10-CM | POA: Diagnosis not present

## 2020-01-04 DIAGNOSIS — M069 Rheumatoid arthritis, unspecified: Secondary | ICD-10-CM | POA: Diagnosis not present

## 2020-01-04 DIAGNOSIS — R945 Abnormal results of liver function studies: Secondary | ICD-10-CM | POA: Diagnosis not present

## 2020-01-04 DIAGNOSIS — E782 Mixed hyperlipidemia: Secondary | ICD-10-CM | POA: Diagnosis not present

## 2020-01-06 DIAGNOSIS — M069 Rheumatoid arthritis, unspecified: Secondary | ICD-10-CM | POA: Diagnosis not present

## 2020-01-06 DIAGNOSIS — F17200 Nicotine dependence, unspecified, uncomplicated: Secondary | ICD-10-CM | POA: Diagnosis not present

## 2020-01-06 DIAGNOSIS — F332 Major depressive disorder, recurrent severe without psychotic features: Secondary | ICD-10-CM | POA: Diagnosis not present

## 2020-01-06 DIAGNOSIS — Z716 Tobacco abuse counseling: Secondary | ICD-10-CM | POA: Diagnosis not present

## 2020-01-19 DIAGNOSIS — M5126 Other intervertebral disc displacement, lumbar region: Secondary | ICD-10-CM | POA: Diagnosis not present

## 2020-01-19 DIAGNOSIS — M5416 Radiculopathy, lumbar region: Secondary | ICD-10-CM | POA: Diagnosis not present

## 2020-01-19 DIAGNOSIS — M48061 Spinal stenosis, lumbar region without neurogenic claudication: Secondary | ICD-10-CM | POA: Diagnosis not present

## 2020-01-19 DIAGNOSIS — M479 Spondylosis, unspecified: Secondary | ICD-10-CM | POA: Diagnosis not present

## 2020-01-29 ENCOUNTER — Encounter (HOSPITAL_COMMUNITY): Payer: Self-pay

## 2020-01-29 ENCOUNTER — Other Ambulatory Visit: Payer: Self-pay

## 2020-01-29 ENCOUNTER — Ambulatory Visit (HOSPITAL_COMMUNITY): Payer: BC Managed Care – PPO | Attending: Neurosurgery

## 2020-01-29 DIAGNOSIS — M6281 Muscle weakness (generalized): Secondary | ICD-10-CM | POA: Diagnosis not present

## 2020-01-29 DIAGNOSIS — M5416 Radiculopathy, lumbar region: Secondary | ICD-10-CM | POA: Diagnosis not present

## 2020-01-29 DIAGNOSIS — R6889 Other general symptoms and signs: Secondary | ICD-10-CM | POA: Insufficient documentation
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
## 2020-01-29 NOTE — Patient Instructions (Signed)
Access Code: [email or web address] URL: [email or web address] Date: 01/29/2020 Prepared by: Sherlyn Lees  Exercises Lying Prone - 1 x daily - 7 x weekly Abdominal Bracing - 1 x daily - 7 x weekly - 3 sets - 10 reps - 3 sec hold

## 2020-02-03 ENCOUNTER — Other Ambulatory Visit: Payer: Self-pay

## 2020-02-03 ENCOUNTER — Ambulatory Visit (HOSPITAL_COMMUNITY): Payer: BC Managed Care – PPO | Admitting: Physical Therapy

## 2020-02-03 ENCOUNTER — Encounter (HOSPITAL_COMMUNITY): Payer: Self-pay | Admitting: Physical Therapy

## 2020-02-03 DIAGNOSIS — M5416 Radiculopathy, lumbar region: Secondary | ICD-10-CM | POA: Diagnosis not present

## 2020-02-03 DIAGNOSIS — R6889 Other general symptoms and signs: Secondary | ICD-10-CM | POA: Diagnosis not present

## 2020-02-03 DIAGNOSIS — M6281 Muscle weakness (generalized): Secondary | ICD-10-CM | POA: Diagnosis not present
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
## 2020-02-05 ENCOUNTER — Encounter (HOSPITAL_COMMUNITY): Payer: Self-pay | Admitting: Physical Therapy

## 2020-02-05 ENCOUNTER — Ambulatory Visit (HOSPITAL_COMMUNITY): Payer: BC Managed Care – PPO | Admitting: Physical Therapy

## 2020-02-05 ENCOUNTER — Other Ambulatory Visit: Payer: Self-pay

## 2020-02-05 DIAGNOSIS — M6281 Muscle weakness (generalized): Secondary | ICD-10-CM

## 2020-02-05 DIAGNOSIS — R6889 Other general symptoms and signs: Secondary | ICD-10-CM

## 2020-02-05 DIAGNOSIS — M5416 Radiculopathy, lumbar region: Secondary | ICD-10-CM
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
## 2020-02-09 ENCOUNTER — Encounter (HOSPITAL_COMMUNITY): Payer: Self-pay

## 2020-02-09 ENCOUNTER — Other Ambulatory Visit: Payer: Self-pay

## 2020-02-09 ENCOUNTER — Ambulatory Visit (HOSPITAL_COMMUNITY): Payer: BC Managed Care – PPO

## 2020-02-09 DIAGNOSIS — M6281 Muscle weakness (generalized): Secondary | ICD-10-CM

## 2020-02-09 DIAGNOSIS — M5416 Radiculopathy, lumbar region: Secondary | ICD-10-CM

## 2020-02-09 DIAGNOSIS — R6889 Other general symptoms and signs: Secondary | ICD-10-CM | POA: Diagnosis not present
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
## 2020-02-09 NOTE — Patient Instructions (Signed)
Access Code: [email or web address] URL: [email or web address] Date: 02/09/2020 Prepared by: Sherlyn Lees  Exercises Cat Cow - 1 x daily - 7 x weekly - 3 sets - 10 reps Seated Table Hamstring Stretch - 1 x daily - 7 x weekly - 3 sets - 10 reps - 2 sec hold Hooklying Isometric Hip Flexion - 1 x daily - 7 x weekly - 3 sets - 10 reps Kettlebell Suitcase Carry - 1 x daily - 7 x weekly

## 2020-02-11 ENCOUNTER — Encounter (HOSPITAL_COMMUNITY): Payer: Self-pay

## 2020-02-11 ENCOUNTER — Ambulatory Visit (HOSPITAL_COMMUNITY): Payer: BC Managed Care – PPO

## 2020-02-11 ENCOUNTER — Other Ambulatory Visit: Payer: Self-pay

## 2020-02-11 DIAGNOSIS — R6889 Other general symptoms and signs: Secondary | ICD-10-CM | POA: Diagnosis not present

## 2020-02-11 DIAGNOSIS — M6281 Muscle weakness (generalized): Secondary | ICD-10-CM

## 2020-02-11 DIAGNOSIS — M5416 Radiculopathy, lumbar region: Secondary | ICD-10-CM | POA: Diagnosis not present

## 2020-02-11 NOTE — Patient Instructions (Addendum)
Gary Woods " Seven Steps to a Pain Free Life"  Log Roll    Lying on back, bend left knee and place left arm across chest. Roll all in one movement to the right. Reverse to roll to the left. Always move as one unit.   Copyright  VHI. All rights reserved.   ABDOMINAL SET WITH NEUTRAL SPINE - NO PICTURE  Pelvic Tilt: Anterior - Legs Bent (Supine)    Rotate pelvis up and arch back. Hold ____ seconds. Relax. Repeat ____ times per set. Do ____ sets per session. Do ____ sessions per day.  http://orth.exer.us/212   Copyright  VHI. All rights reserved.   Bridge    Lie back, legs bent. Inhale, pressing hips up. Keeping ribs in, lengthen lower back. Exhale, rolling down along spine from top. Repeat ____ times. Do ____ sessions per day.  http://pm.exer.us/54   Copyright  VHI. All rights reserved.
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
## 2020-02-16 ENCOUNTER — Ambulatory Visit (HOSPITAL_COMMUNITY): Payer: BC Managed Care – PPO | Attending: Neurosurgery

## 2020-02-16 ENCOUNTER — Encounter (HOSPITAL_COMMUNITY): Payer: Self-pay

## 2020-02-16 ENCOUNTER — Other Ambulatory Visit: Payer: Self-pay

## 2020-02-16 DIAGNOSIS — R6889 Other general symptoms and signs: Secondary | ICD-10-CM

## 2020-02-16 DIAGNOSIS — M5416 Radiculopathy, lumbar region: Secondary | ICD-10-CM | POA: Insufficient documentation

## 2020-02-16 DIAGNOSIS — M6281 Muscle weakness (generalized): Secondary | ICD-10-CM
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
## 2020-02-18 ENCOUNTER — Encounter (HOSPITAL_COMMUNITY): Payer: Self-pay

## 2020-02-18 ENCOUNTER — Other Ambulatory Visit: Payer: Self-pay

## 2020-02-18 ENCOUNTER — Ambulatory Visit (HOSPITAL_COMMUNITY): Payer: BC Managed Care – PPO

## 2020-02-18 DIAGNOSIS — M5416 Radiculopathy, lumbar region: Secondary | ICD-10-CM | POA: Diagnosis not present

## 2020-02-18 DIAGNOSIS — R6889 Other general symptoms and signs: Secondary | ICD-10-CM

## 2020-02-18 DIAGNOSIS — M6281 Muscle weakness (generalized): Secondary | ICD-10-CM | POA: Diagnosis not present
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
## 2020-02-23 ENCOUNTER — Ambulatory Visit (HOSPITAL_COMMUNITY): Payer: BC Managed Care – PPO | Admitting: Physical Therapy

## 2020-02-23 ENCOUNTER — Other Ambulatory Visit: Payer: Self-pay

## 2020-02-23 ENCOUNTER — Encounter (HOSPITAL_COMMUNITY): Payer: Self-pay | Admitting: Physical Therapy

## 2020-02-23 DIAGNOSIS — M5416 Radiculopathy, lumbar region: Secondary | ICD-10-CM | POA: Diagnosis not present

## 2020-02-23 DIAGNOSIS — R6889 Other general symptoms and signs: Secondary | ICD-10-CM | POA: Diagnosis not present

## 2020-02-23 DIAGNOSIS — M6281 Muscle weakness (generalized): Secondary | ICD-10-CM
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
## 2020-02-23 NOTE — Patient Instructions (Signed)
Access Code: [email or web address] URL: [email or web address] Date: 02/23/2020 Prepared by: Yetta Glassman  Exercises Sidelying Hip Abduction - 4 sets - 5 reps - 2 hold

## 2020-02-25 ENCOUNTER — Other Ambulatory Visit: Payer: Self-pay

## 2020-02-25 ENCOUNTER — Encounter (HOSPITAL_COMMUNITY): Payer: Self-pay

## 2020-02-25 ENCOUNTER — Ambulatory Visit (HOSPITAL_COMMUNITY): Payer: BC Managed Care – PPO

## 2020-02-25 DIAGNOSIS — R6889 Other general symptoms and signs: Secondary | ICD-10-CM

## 2020-02-25 DIAGNOSIS — M6281 Muscle weakness (generalized): Secondary | ICD-10-CM

## 2020-02-25 DIAGNOSIS — M5416 Radiculopathy, lumbar region: Secondary | ICD-10-CM | POA: Diagnosis not present
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
## 2020-03-01 ENCOUNTER — Other Ambulatory Visit: Payer: Self-pay

## 2020-03-01 ENCOUNTER — Ambulatory Visit (HOSPITAL_COMMUNITY): Payer: BC Managed Care – PPO

## 2020-03-01 ENCOUNTER — Encounter (HOSPITAL_COMMUNITY): Payer: Self-pay

## 2020-03-01 DIAGNOSIS — M5416 Radiculopathy, lumbar region: Secondary | ICD-10-CM

## 2020-03-01 DIAGNOSIS — M6281 Muscle weakness (generalized): Secondary | ICD-10-CM | POA: Diagnosis not present

## 2020-03-01 DIAGNOSIS — R6889 Other general symptoms and signs: Secondary | ICD-10-CM
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
## 2020-03-03 ENCOUNTER — Ambulatory Visit (HOSPITAL_COMMUNITY): Payer: BC Managed Care – PPO

## 2020-03-08 ENCOUNTER — Encounter (HOSPITAL_COMMUNITY): Payer: BC Managed Care – PPO

## 2020-03-08 DIAGNOSIS — M0589 Other rheumatoid arthritis with rheumatoid factor of multiple sites: Secondary | ICD-10-CM | POA: Diagnosis not present

## 2020-03-08 DIAGNOSIS — Z79899 Other long term (current) drug therapy: Secondary | ICD-10-CM | POA: Diagnosis not present

## 2020-03-08 DIAGNOSIS — M2550 Pain in unspecified joint: Secondary | ICD-10-CM | POA: Diagnosis not present

## 2020-03-10 ENCOUNTER — Encounter (HOSPITAL_COMMUNITY): Payer: BC Managed Care – PPO | Admitting: Physical Therapy

## 2020-03-17 ENCOUNTER — Other Ambulatory Visit: Payer: Self-pay | Admitting: Neurosurgery

## 2020-03-17 DIAGNOSIS — M5416 Radiculopathy, lumbar region: Secondary | ICD-10-CM

## 2020-04-08 ENCOUNTER — Other Ambulatory Visit: Payer: Self-pay

## 2020-04-08 ENCOUNTER — Ambulatory Visit
Admission: RE | Admit: 2020-04-08 | Discharge: 2020-04-08 | Disposition: A | Discharge status: Home / Self Care | Payer: BC Managed Care – PPO | Source: Ambulatory Visit | Attending: Neurosurgery | Admitting: Neurosurgery

## 2020-04-08 DIAGNOSIS — M5126 Other intervertebral disc displacement, lumbar region: Secondary | ICD-10-CM | POA: Diagnosis not present

## 2020-04-08 DIAGNOSIS — M47816 Spondylosis without myelopathy or radiculopathy, lumbar region: Secondary | ICD-10-CM | POA: Diagnosis not present

## 2020-04-08 DIAGNOSIS — M48061 Spinal stenosis, lumbar region without neurogenic claudication: Secondary | ICD-10-CM | POA: Diagnosis not present

## 2020-04-08 DIAGNOSIS — M5416 Radiculopathy, lumbar region: Secondary | ICD-10-CM

## 2020-04-08 DIAGNOSIS — R531 Weakness: Secondary | ICD-10-CM | POA: Diagnosis not present

## 2020-04-08 MED ORDER — GADOBENATE DIMEGLUMINE 529 MG/ML IV SOLN
20.0000 mL | Freq: Once | INTRAVENOUS | Status: AC | PRN
  Administered 2020-04-08: 20 mL via INTRAVENOUS

## 2020-04-20 DIAGNOSIS — M5416 Radiculopathy, lumbar region: Secondary | ICD-10-CM | POA: Diagnosis not present

## 2020-04-20 DIAGNOSIS — M545 Low back pain, unspecified: Secondary | ICD-10-CM | POA: Diagnosis not present

## 2020-04-20 DIAGNOSIS — M479 Spondylosis, unspecified: Secondary | ICD-10-CM | POA: Diagnosis not present

## 2020-05-09 DIAGNOSIS — E782 Mixed hyperlipidemia: Secondary | ICD-10-CM | POA: Diagnosis not present

## 2020-05-09 DIAGNOSIS — R7301 Impaired fasting glucose: Secondary | ICD-10-CM | POA: Diagnosis not present

## 2020-05-09 DIAGNOSIS — R945 Abnormal results of liver function studies: Secondary | ICD-10-CM | POA: Diagnosis not present

## 2020-05-11 DIAGNOSIS — E782 Mixed hyperlipidemia: Secondary | ICD-10-CM | POA: Diagnosis not present

## 2020-05-11 DIAGNOSIS — K2100 Gastro-esophageal reflux disease with esophagitis, without bleeding: Secondary | ICD-10-CM | POA: Diagnosis not present

## 2020-05-11 DIAGNOSIS — M069 Rheumatoid arthritis, unspecified: Secondary | ICD-10-CM | POA: Diagnosis not present

## 2020-05-11 DIAGNOSIS — F332 Major depressive disorder, recurrent severe without psychotic features: Secondary | ICD-10-CM | POA: Diagnosis not present

## 2020-08-22 DIAGNOSIS — M545 Low back pain, unspecified: Secondary | ICD-10-CM | POA: Diagnosis not present

## 2020-08-22 DIAGNOSIS — M48061 Spinal stenosis, lumbar region without neurogenic claudication: Secondary | ICD-10-CM | POA: Diagnosis not present

## 2020-08-22 DIAGNOSIS — M5126 Other intervertebral disc displacement, lumbar region: Secondary | ICD-10-CM | POA: Diagnosis not present

## 2020-08-22 DIAGNOSIS — M5416 Radiculopathy, lumbar region: Secondary | ICD-10-CM | POA: Diagnosis not present

## 2020-10-06 DIAGNOSIS — B070 Plantar wart: Secondary | ICD-10-CM | POA: Diagnosis not present

## 2020-11-02 DIAGNOSIS — R7301 Impaired fasting glucose: Secondary | ICD-10-CM | POA: Diagnosis not present

## 2020-11-02 DIAGNOSIS — E782 Mixed hyperlipidemia: Secondary | ICD-10-CM | POA: Diagnosis not present

## 2020-11-08 DIAGNOSIS — E782 Mixed hyperlipidemia: Secondary | ICD-10-CM | POA: Diagnosis not present

## 2020-11-08 DIAGNOSIS — B070 Plantar wart: Secondary | ICD-10-CM | POA: Diagnosis not present

## 2020-11-08 DIAGNOSIS — Z23 Encounter for immunization: Secondary | ICD-10-CM | POA: Diagnosis not present

## 2020-11-08 DIAGNOSIS — E875 Hyperkalemia: Secondary | ICD-10-CM | POA: Diagnosis not present

## 2020-11-08 DIAGNOSIS — R7301 Impaired fasting glucose: Secondary | ICD-10-CM | POA: Diagnosis not present

## 2020-11-15 ENCOUNTER — Encounter: Payer: Self-pay | Admitting: Internal Medicine

## 2020-12-01 DIAGNOSIS — R5383 Other fatigue: Secondary | ICD-10-CM | POA: Diagnosis not present

## 2020-12-01 DIAGNOSIS — M2550 Pain in unspecified joint: Secondary | ICD-10-CM | POA: Diagnosis not present

## 2020-12-01 DIAGNOSIS — M0589 Other rheumatoid arthritis with rheumatoid factor of multiple sites: Secondary | ICD-10-CM | POA: Diagnosis not present

## 2020-12-01 DIAGNOSIS — Z79899 Other long term (current) drug therapy: Secondary | ICD-10-CM | POA: Diagnosis not present

## 2020-12-05 ENCOUNTER — Ambulatory Visit: Payer: BC Managed Care – PPO

## 2021-01-12 DIAGNOSIS — H6123 Impacted cerumen, bilateral: Secondary | ICD-10-CM | POA: Diagnosis not present

## 2021-01-12 DIAGNOSIS — F411 Generalized anxiety disorder: Secondary | ICD-10-CM | POA: Diagnosis not present

## 2021-01-12 DIAGNOSIS — B070 Plantar wart: Secondary | ICD-10-CM | POA: Diagnosis not present

## 2021-01-12 DIAGNOSIS — D239 Other benign neoplasm of skin, unspecified: Secondary | ICD-10-CM | POA: Diagnosis not present

## 2021-01-12 DIAGNOSIS — F319 Bipolar disorder, unspecified: Secondary | ICD-10-CM | POA: Diagnosis not present

## 2021-01-30 ENCOUNTER — Ambulatory Visit (INDEPENDENT_AMBULATORY_CARE_PROVIDER_SITE_OTHER): Payer: Self-pay | Admitting: *Deleted

## 2021-01-30 ENCOUNTER — Other Ambulatory Visit: Payer: Self-pay

## 2021-01-30 VITALS — Ht 72.0 in | Wt 205.0 lb

## 2021-01-30 DIAGNOSIS — Z1211 Encounter for screening for malignant neoplasm of colon: Secondary | ICD-10-CM

## 2021-01-30 NOTE — Progress Notes (Addendum)
Gastroenterology Pre-Procedure Review  Request Date: 01/30/2021 Requesting Physician: Eloy End, FNP-C, no previous TCS  PATIENT REVIEW QUESTIONS: The patient responded to the following health history questions as indicated:    1. Diabetes Melitis: no, pre-diabetic managed by diet and exercise 2. Joint replacements in the past 12 months: no 3. Major health problems in the past 3 months: no 4. Has an artificial valve or MVP: no 5. Has a defibrillator: no 6. Has been advised in past to take antibiotics in advance of a procedure like teeth cleaning: no 7. Family history of colon cancer: no  8. Alcohol Use: yes, 1 glass of wine or 1 beer a month 9. Illicit drug Use: no 10. History of sleep apnea: no 11. History of coronary artery or other vascular stents placed within the last 12 months: no 12. History of any prior anesthesia complications: yes, dizzy headed and nausea after tooth extraction 13. Body mass index is 27.8 kg/m.    MEDICATIONS & ALLERGIES:    Patient reports the following regarding taking any blood thinners:   Plavix? no Aspirin? no Coumadin? no Brilinta? no Xarelto? no Eliquis? no Pradaxa? no Savaysa? no Effient? no  Patient confirms/reports the following medications:  Current Outpatient Medications  Medication Sig Dispense Refill   Adalimumab (HUMIRA) 40 MG/0.8ML PSKT once a week.     ALPRAZolam (XANAX) 1 MG tablet Take 1 tablet by mouth as needed.  4   atorvastatin (LIPITOR) 20 MG tablet Take 20 mg by mouth daily.     benzonatate (TESSALON) 100 MG capsule Take 1 capsule (100 mg total) by mouth every 8 (eight) hours. (Patient taking differently: Take 100 mg by mouth as needed.) 21 capsule 0   cyclobenzaprine (FLEXERIL) 10 MG tablet Take 1 tablet (10 mg total) by mouth at bedtime. (Patient taking differently: Take 10 mg by mouth as needed.) 15 tablet 0   pantoprazole (PROTONIX) 40 MG tablet Take 40 mg by mouth daily.     PARoxetine (PAXIL) 20 MG tablet Take 20 mg  by mouth daily.     No current facility-administered medications for this visit.    Patient confirms/reports the following allergies:  No Known Allergies  No orders of the defined types were placed in this encounter.   AUTHORIZATION INFORMATION Primary Insurance: BCBS,  [place] #: [id number],  Group #: [id number] Pre-Cert / Auth required: No, not required  SCHEDULE INFORMATION: Procedure has been scheduled as follows:  Date: 02/14/2021, Time: 12:00 Location: [facility] with Dr. Abbey Chatters

## 2021-01-31 ENCOUNTER — Encounter: Payer: Self-pay | Admitting: *Deleted

## 2021-01-31 MED ORDER — PEG 3350-KCL-NA BICARB-NACL 420 G PO SOLR: 4000.0000 mL | Freq: Once | ORAL | 0 refills | Status: AC

## 2021-01-31 NOTE — Addendum Note (Signed)
Addended by: Metro Kung on: 01/31/2021 12:48 PM   Modules accepted: Orders

## 2021-01-31 NOTE — Progress Notes (Signed)
ASA 2. Recommend with Propofol.

## 2021-01-31 NOTE — Progress Notes (Signed)
Spoke to pt.  Scheduled procedure for 02/14/2021 with arrival at 10:30.  Reviewed prep instructions with pt.  Confirmed mailing and email address with pt.  Sending to [email or web address] @ [email or web address] and mailing out prep instructions to pt as well.

## 2021-02-02 DIAGNOSIS — L720 Epidermal cyst: Secondary | ICD-10-CM | POA: Diagnosis not present

## 2021-02-02 DIAGNOSIS — M79671 Pain in right foot: Secondary | ICD-10-CM | POA: Diagnosis not present

## 2021-02-08 DIAGNOSIS — F411 Generalized anxiety disorder: Secondary | ICD-10-CM | POA: Diagnosis not present

## 2021-02-08 DIAGNOSIS — F319 Bipolar disorder, unspecified: Secondary | ICD-10-CM | POA: Diagnosis not present

## 2021-02-08 DIAGNOSIS — H9312 Tinnitus, left ear: Secondary | ICD-10-CM | POA: Diagnosis not present

## 2021-02-14 ENCOUNTER — Ambulatory Visit (HOSPITAL_COMMUNITY): Payer: BC Managed Care – PPO | Admitting: Anesthesiology

## 2021-02-14 ENCOUNTER — Encounter (HOSPITAL_COMMUNITY): Payer: Self-pay

## 2021-02-14 ENCOUNTER — Other Ambulatory Visit: Payer: Self-pay

## 2021-02-14 ENCOUNTER — Ambulatory Visit (HOSPITAL_COMMUNITY)
Admission: RE | Admit: 2021-02-14 | Discharge: 2021-02-14 | Disposition: A | Discharge status: Home / Self Care | Payer: BC Managed Care – PPO | Attending: Internal Medicine | Admitting: Internal Medicine

## 2021-02-14 ENCOUNTER — Encounter (HOSPITAL_COMMUNITY)
Admission: RE | Disposition: A | Discharge status: Home / Self Care | Payer: Self-pay | Source: Home / Self Care | Attending: Internal Medicine

## 2021-02-14 DIAGNOSIS — F32A Depression, unspecified: Secondary | ICD-10-CM | POA: Insufficient documentation

## 2021-02-14 DIAGNOSIS — M069 Rheumatoid arthritis, unspecified: Secondary | ICD-10-CM | POA: Diagnosis not present

## 2021-02-14 DIAGNOSIS — F419 Anxiety disorder, unspecified: Secondary | ICD-10-CM | POA: Diagnosis not present

## 2021-02-14 DIAGNOSIS — F17210 Nicotine dependence, cigarettes, uncomplicated: Secondary | ICD-10-CM | POA: Insufficient documentation

## 2021-02-14 DIAGNOSIS — K635 Polyp of colon: Secondary | ICD-10-CM | POA: Diagnosis not present

## 2021-02-14 DIAGNOSIS — K219 Gastro-esophageal reflux disease without esophagitis: Secondary | ICD-10-CM | POA: Diagnosis not present

## 2021-02-14 DIAGNOSIS — D124 Benign neoplasm of descending colon: Secondary | ICD-10-CM | POA: Diagnosis not present

## 2021-02-14 DIAGNOSIS — K648 Other hemorrhoids: Secondary | ICD-10-CM | POA: Insufficient documentation

## 2021-02-14 DIAGNOSIS — Z1211 Encounter for screening for malignant neoplasm of colon: Secondary | ICD-10-CM

## 2021-02-14 DIAGNOSIS — Z79899 Other long term (current) drug therapy: Secondary | ICD-10-CM | POA: Diagnosis not present

## 2021-02-14 DIAGNOSIS — J449 Chronic obstructive pulmonary disease, unspecified: Secondary | ICD-10-CM | POA: Diagnosis not present

## 2021-02-14 HISTORY — PX: POLYPECTOMY: SHX5525

## 2021-02-14 HISTORY — PX: COLONOSCOPY WITH PROPOFOL: SHX5780

## 2021-02-14 SURGERY — COLONOSCOPY WITH PROPOFOL
Anesthesia: General

## 2021-02-14 MED ORDER — LIDOCAINE HCL (CARDIAC) PF 100 MG/5ML IV SOSY
PREFILLED_SYRINGE | INTRAVENOUS | Status: DC | PRN
Start: 2021-02-14 — End: 2021-02-14
  Administered 2021-02-14: 50 mg via INTRAVENOUS

## 2021-02-14 MED ORDER — STERILE WATER FOR IRRIGATION IR SOLN
Status: DC | PRN
  Administered 2021-02-14: 120 mL

## 2021-02-14 MED ORDER — PROPOFOL 10 MG/ML IV BOLUS
INTRAVENOUS | Status: DC | PRN
  Administered 2021-02-14 (×3): 50 mg via INTRAVENOUS
  Administered 2021-02-14: 100 mg via INTRAVENOUS
  Administered 2021-02-14 (×2): 50 mg via INTRAVENOUS

## 2021-02-14 MED ORDER — LACTATED RINGERS IV SOLN: INTRAVENOUS | Status: DC

## 2021-02-14 NOTE — Op Note (Signed)
[facility] Patient Name: Gary Woods Procedure Date: 02/14/2021 10:51 AM MRN: [id number] Date of Birth: August 11, 1970 Attending MD: Elon Alas. Edgar Frisk CSN: [id number] Age: 51 Admit Type: Outpatient Procedure:                Colonoscopy Indications:              Screening for colorectal malignant neoplasm Providers:                Elon Alas. Abbey Chatters, DO, Caprice Kluver, Aram Candela Referring MD:              Medicines:                See the Anesthesia note for documentation of the                            administered medications Complications:            No immediate complications. Estimated Blood Loss:     Estimated blood loss was minimal. Procedure:                Pre-Anesthesia Assessment:                           - The anesthesia plan was to use monitored                            anesthesia care (MAC).                           After obtaining informed consent, the colonoscope                            was passed under direct vision. Throughout the                            procedure, the patient's blood pressure, pulse, and                            oxygen saturations were monitored continuously. The                            [id number]-HQ190L ([id number]) scope was introduced through                            the anus and advanced to the the cecum, identified                            by appendiceal orifice and ileocecal valve. The                            colonoscopy was performed without difficulty. The                            patient tolerated the procedure well. The quality                            of  the bowel preparation was evaluated using the                            BBPS [facility] Bowel Preparation Scale) with scores                            of: Right Colon = 3, Transverse Colon = 3 and Left                            Colon = 3 (entire mucosa seen well with no residual                            staining, small fragments of stool or opaque                             liquid). The total BBPS score equals 9. Scope In: 11:10:02 AM Scope Out: 11:24:36 AM Scope Withdrawal Time: 0 hours 12 minutes 4 seconds  Total Procedure Duration: 0 hours 14 minutes 34 seconds  Findings:      The perianal and digital rectal examinations were normal.      Non-bleeding internal hemorrhoids were found during endoscopy.      A 5 mm polyp was found in the descending colon. The polyp was sessile.       The polyp was removed with a cold snare. Resection and retrieval were       complete.      The exam was otherwise without abnormality. Impression:               - Non-bleeding internal hemorrhoids.                           - One 5 mm polyp in the descending colon, removed                            with a cold snare. Resected and retrieved.                           - The examination was otherwise normal. Moderate Sedation:      Per Anesthesia Care Recommendation:           - Patient has a contact number available for                            emergencies. The signs and symptoms of potential                            delayed complications were discussed with the                            patient. Return to normal activities tomorrow.                            Written discharge instructions were provided to the  patient.                           - Resume previous diet.                           - Continue present medications.                           - Await pathology results.                           - Repeat colonoscopy in 5-10 years for surveillance.                           - Return to GI clinic PRN. Procedure Code(s):        --- Professional ---                           [phone], Colonoscopy, flexible; with removal of                            tumor(s), polyp(s), or other lesion(s) by snare                            technique Diagnosis Code(s):        --- Professional ---                           Z12.11, Encounter for  screening for malignant                            neoplasm of colon                           K63.5, Polyp of colon                           K64.8, Other hemorrhoids CPT copyright 2019 American Medical Association. All rights reserved. The codes documented in this report are preliminary and upon coder review may  be revised to meet current compliance requirements. Elon Alas. Abbey Chatters, DO [place] Abbey Chatters, DO 02/14/2021 11:27:39 AM This report has been signed electronically. Number of Addenda: 0

## 2021-02-14 NOTE — Anesthesia Postprocedure Evaluation (Signed)
Anesthesia Post Note  Patient: Gary Woods  Procedure(s) Performed: COLONOSCOPY WITH PROPOFOL POLYPECTOMY  Patient location during evaluation: Phase II Anesthesia Type: General Level of consciousness: awake and alert and oriented Pain management: pain level controlled Vital Signs Assessment: post-procedure vital signs reviewed and stable Respiratory status: spontaneous breathing, nonlabored ventilation and respiratory function stable Cardiovascular status: blood pressure returned to baseline and stable Postop Assessment: no apparent nausea or vomiting Anesthetic complications: no   No notable events documented.   Last Vitals:  Vitals:   02/14/21 1036 02/14/21 1128  BP: 139/76 113/66  Pulse: 62 62  Resp: 19 19  Temp: 36.5 C 36.9 C  SpO2: 94% 97%    Last Pain:  Vitals:   02/14/21 1128  TempSrc: Axillary  PainSc:                  Charlotte Fidalgo C Timiyah Romito

## 2021-02-14 NOTE — Discharge Instructions (Addendum)
°  Colonoscopy Discharge Instructions  Read the instructions outlined below and refer to this sheet in the next few weeks. These discharge instructions provide you with general information on caring for yourself after you leave the hospital. Your doctor may also give you specific instructions. While your treatment has been planned according to the most current medical practices available, unavoidable complications occasionally occur.   ACTIVITY You may resume your regular activity, but move at a slower pace for the next 24 hours.  Take frequent rest periods for the next 24 hours.  Walking will help get rid of the air and reduce the bloated feeling in your belly (abdomen).  No driving for 24 hours (because of the medicine (anesthesia) used during the test).   Do not sign any important legal documents or operate any machinery for 24 hours (because of the anesthesia used during the test).  NUTRITION Drink plenty of fluids.  You may resume your normal diet as instructed by your doctor.  Begin with a light meal and progress to your normal diet. Heavy or fried foods are harder to digest and may make you feel sick to your stomach (nauseated).  Avoid alcoholic beverages for 24 hours or as instructed.  MEDICATIONS You may resume your normal medications unless your doctor tells you otherwise.  WHAT YOU CAN EXPECT TODAY Some feelings of bloating in the abdomen.  Passage of more gas than usual.  Spotting of blood in your stool or on the toilet paper.  IF YOU HAD POLYPS REMOVED DURING THE COLONOSCOPY: No aspirin products for 7 days or as instructed.  No alcohol for 7 days or as instructed.  Eat a soft diet for the next 24 hours.  FINDING OUT THE RESULTS OF YOUR TEST Not all test results are available during your visit. If your test results are not back during the visit, make an appointment with your caregiver to find out the results. Do not assume everything is normal if you have not heard from your  caregiver or the medical facility. It is important for you to follow up on all of your test results.  SEEK IMMEDIATE MEDICAL ATTENTION IF: You have more than a spotting of blood in your stool.  Your belly is swollen (abdominal distention).  You are nauseated or vomiting.  You have a temperature over 101.  You have abdominal pain or discomfort that is severe or gets worse throughout the day.   Your colonoscopy revealed 1 polyp(s) which I removed successfully. Await pathology results, my office will contact you. I recommend repeating colonoscopy in 5-10 years for surveillance purposes depending on pathology results.    I hope you have a great rest of your week!  Elon Alas. Abbey Chatters, D.O. Gastroenterology and Hepatology [facility]

## 2021-02-14 NOTE — H&P (Signed)
Primary Care Physician:  Celene Squibb, MD Primary Gastroenterologist:  Dr. Abbey Chatters  Pre-Procedure History & Physical: HPI:  Gary Woods is a 51 y.o. male is here for first ever colonoscopy for colon cancer screening purposes.  Patient denies any family history of colorectal cancer.  No melena or hematochezia.  No abdominal pain or unintentional weight loss.  No change in bowel habits.  Overall feels well from a GI standpoint.  Past Medical History:  Diagnosis Date   Anxiety    Collagen vascular disease (HCC)    COPD (chronic obstructive pulmonary disease) ([place])    Depression    Rheumatoid arthritis ([place])     Past Surgical History:  Procedure Laterality Date   BACK SURGERY      Prior to Admission medications   Medication Sig Start Date End Date Taking? Authorizing Provider  Adalimumab (HUMIRA) 40 MG/0.8ML PSKT Inject 40 mg into the muscle every Saturday.   Yes [provider]  ALPRAZolam Duanne Moron) 1 MG tablet Take 1 mg by mouth 2 (two) times daily as needed for anxiety. 03/10/16  Yes [provider]  atorvastatin (LIPITOR) 20 MG tablet Take 20 mg by mouth daily. 12/26/20  Yes [provider]  pantoprazole (PROTONIX) 40 MG tablet Take 40 mg by mouth daily.   Yes [provider]  PARoxetine (PAXIL) 20 MG tablet Take 20 mg by mouth daily.   Yes [provider]  cyclobenzaprine (FLEXERIL) 10 MG tablet Take 1 tablet (10 mg total) by mouth at bedtime. Patient taking differently: Take 10 mg by mouth 3 (three) times daily as needed for muscle spasms. 06/04/19   [place], [place], PA-C    Allergies as of 01/31/2021   (No Known Allergies)    Family History  Problem Relation Age of Onset   CAD Neg Hx     Social History   Socioeconomic History   Marital status: Divorced    Spouse name: Not on file   Number of children: Not on file   Years of education: Not on file   Highest education level: Not on file  Occupational History   Occupation:  Architect  Tobacco Use   Smoking status: Every Day    Packs/day: 1.00    Years: 25.00    Pack years: 25.00    Types: Cigarettes   Smokeless tobacco: Never  Vaping Use   Vaping Use: Never used  Substance and Sexual Activity   Alcohol use: No   Drug use: No   Sexual activity: Yes  Other Topics Concern   Not on file  Social History Narrative   Not on file   Social Determinants of Health   Financial Resource Strain: Not on file  Food Insecurity: Not on file  Transportation Needs: Not on file  Physical Activity: Not on file  Stress: Not on file  Social Connections: Not on file  Intimate Partner Violence: Not on file    Review of Systems: See HPI, otherwise negative ROS  Physical Exam: Vital signs in last 24 hours: Temp:  [97.7 F (36.5 C)] 97.7 F (36.5 C) (01/31 1036) Pulse Rate:  [62] 62 (01/31 1036) Resp:  [19] 19 (01/31 1036) BP: (139)/(76) 139/76 (01/31 1036) SpO2:  [94 %] 94 % (01/31 1036) Weight:  [93 kg] 93 kg (01/31 1036)   General:   Alert,  Well-developed, well-nourished, pleasant and cooperative in NAD Head:  Normocephalic and atraumatic. Eyes:  Sclera clear, no icterus.   Conjunctiva pink. Ears:  Normal auditory acuity. Nose:  No deformity, discharge,  or lesions. Mouth:  No deformity or lesions, dentition normal. Neck:  Supple; no masses or thyromegaly. Lungs:  Clear throughout to auscultation.   No wheezes, crackles, or rhonchi. No acute distress. Heart:  Regular rate and rhythm; no murmurs, clicks, rubs,  or gallops. Abdomen:  Soft, nontender and nondistended. No masses, hepatosplenomegaly or hernias noted. Normal bowel sounds, without guarding, and without rebound.   Msk:  Symmetrical without gross deformities. Normal posture. Extremities:  Without clubbing or edema. Neurologic:  Alert and  oriented x4;  grossly normal neurologically. Skin:  Intact without significant lesions or rashes. Cervical Nodes:  No significant cervical  adenopathy. Psych:  Alert and cooperative. Normal mood and affect.  Impression/Plan: Gary Woods is here for a colonoscopy to be performed for colon cancer screening purposes.  The risks of the procedure including infection, bleed, or perforation as well as benefits, limitations, alternatives and imponderables have been reviewed with the patient. Questions have been answered. All parties agreeable.

## 2021-02-14 NOTE — Anesthesia Preprocedure Evaluation (Addendum)
Anesthesia Evaluation  Patient identified by MRN, date of birth, ID band Patient awake    Reviewed: Allergy & Precautions, NPO status , Patient's Chart, lab work & pertinent test results  Airway Mallampati: II  TM Distance: >3 FB Neck ROM: Full    Dental  (+) Dental Advisory Given, Teeth Intact   Pulmonary COPD, Current SmokerPatient did not abstain from smoking.,    Pulmonary exam normal breath sounds clear to auscultation       Cardiovascular negative cardio ROS Normal cardiovascular exam Rhythm:Regular Rate:Normal     Neuro/Psych PSYCHIATRIC DISORDERS Anxiety Depression negative neurological ROS     GI/Hepatic Neg liver ROS, GERD  Medicated and Controlled,  Endo/Other  negative endocrine ROS  Renal/GU negative Renal ROS  negative genitourinary   Musculoskeletal  (+) Arthritis , Rheumatoid disorders,    Abdominal   Peds negative pediatric ROS (+)  Hematology negative hematology ROS (+)   Anesthesia Other Findings   Reproductive/Obstetrics negative OB ROS                            Anesthesia Physical Anesthesia Plan  ASA: 2  Anesthesia Plan: General   Post-op Pain Management: Minimal or no pain anticipated   Induction: Intravenous  PONV Risk Score and Plan: TIVA  Airway Management Planned: Nasal Cannula and Natural Airway  Additional Equipment:   Intra-op Plan:   Post-operative Plan:   Informed Consent: I have reviewed the patients History and Physical, chart, labs and discussed the procedure including the risks, benefits and alternatives for the proposed anesthesia with the patient or authorized representative who has indicated his/her understanding and acceptance.     Dental advisory given  Plan Discussed with: CRNA and Surgeon  Anesthesia Plan Comments:         Anesthesia Quick Evaluation

## 2021-02-14 NOTE — Transfer of Care (Signed)
Immediate Anesthesia Transfer of Care Note  Patient: Gary Woods  Procedure(s) Performed: COLONOSCOPY WITH PROPOFOL POLYPECTOMY  Patient Location: Endoscopy Unit  Anesthesia Type:General  Level of Consciousness: drowsy  Airway & Oxygen Therapy: Patient Spontanous Breathing  Post-op Assessment: Report given to RN and Post -op Vital signs reviewed and stable  Post vital signs: Reviewed and stable  Last Vitals:  Vitals Value Taken Time  BP    Temp    Pulse 64   Resp    SpO2 96%     Last Pain:  Vitals:   02/14/21 1105  TempSrc:   PainSc: 0-No pain      Patients Stated Pain Goal: 8 (61/53/79 4327)  Complications: No notable events documented.

## 2021-02-15 LAB — SURGICAL PATHOLOGY

## 2021-02-16 ENCOUNTER — Encounter (HOSPITAL_COMMUNITY): Payer: Self-pay | Admitting: Internal Medicine

## 2021-02-23 DIAGNOSIS — M79671 Pain in right foot: Secondary | ICD-10-CM | POA: Diagnosis not present

## 2021-02-23 DIAGNOSIS — L720 Epidermal cyst: Secondary | ICD-10-CM | POA: Diagnosis not present

## 2021-03-09 DIAGNOSIS — R7301 Impaired fasting glucose: Secondary | ICD-10-CM | POA: Diagnosis not present

## 2021-03-09 DIAGNOSIS — E782 Mixed hyperlipidemia: Secondary | ICD-10-CM | POA: Diagnosis not present

## 2021-03-09 DIAGNOSIS — Z125 Encounter for screening for malignant neoplasm of prostate: Secondary | ICD-10-CM | POA: Diagnosis not present

## 2021-03-14 DIAGNOSIS — L989 Disorder of the skin and subcutaneous tissue, unspecified: Secondary | ICD-10-CM | POA: Diagnosis not present

## 2021-03-14 DIAGNOSIS — F319 Bipolar disorder, unspecified: Secondary | ICD-10-CM | POA: Diagnosis not present

## 2021-03-14 DIAGNOSIS — F411 Generalized anxiety disorder: Secondary | ICD-10-CM | POA: Diagnosis not present

## 2021-03-14 DIAGNOSIS — E785 Hyperlipidemia, unspecified: Secondary | ICD-10-CM | POA: Diagnosis not present

## 2021-03-28 DIAGNOSIS — H938X1 Other specified disorders of right ear: Secondary | ICD-10-CM | POA: Diagnosis not present

## 2021-03-28 DIAGNOSIS — N6459 Other signs and symptoms in breast: Secondary | ICD-10-CM | POA: Diagnosis not present

## 2021-04-20 DIAGNOSIS — F17210 Nicotine dependence, cigarettes, uncomplicated: Secondary | ICD-10-CM | POA: Diagnosis not present

## 2021-04-20 DIAGNOSIS — H6123 Impacted cerumen, bilateral: Secondary | ICD-10-CM | POA: Diagnosis not present

## 2021-05-31 DIAGNOSIS — M1991 Primary osteoarthritis, unspecified site: Secondary | ICD-10-CM | POA: Diagnosis not present

## 2021-05-31 DIAGNOSIS — Z79899 Other long term (current) drug therapy: Secondary | ICD-10-CM | POA: Diagnosis not present

## 2021-05-31 DIAGNOSIS — M0589 Other rheumatoid arthritis with rheumatoid factor of multiple sites: Secondary | ICD-10-CM | POA: Diagnosis not present

## 2021-07-13 DIAGNOSIS — F419 Anxiety disorder, unspecified: Secondary | ICD-10-CM | POA: Diagnosis not present

## 2021-09-14 ENCOUNTER — Other Ambulatory Visit (HOSPITAL_COMMUNITY): Payer: Self-pay | Admitting: Internal Medicine

## 2021-09-14 ENCOUNTER — Other Ambulatory Visit: Payer: Self-pay | Admitting: Internal Medicine

## 2021-09-14 DIAGNOSIS — E782 Mixed hyperlipidemia: Secondary | ICD-10-CM | POA: Diagnosis not present

## 2021-09-14 DIAGNOSIS — M069 Rheumatoid arthritis, unspecified: Secondary | ICD-10-CM | POA: Diagnosis not present

## 2021-09-14 DIAGNOSIS — R7301 Impaired fasting glucose: Secondary | ICD-10-CM | POA: Diagnosis not present

## 2021-09-14 DIAGNOSIS — K644 Residual hemorrhoidal skin tags: Secondary | ICD-10-CM | POA: Diagnosis not present

## 2021-09-14 DIAGNOSIS — E875 Hyperkalemia: Secondary | ICD-10-CM | POA: Diagnosis not present

## 2021-09-14 DIAGNOSIS — R109 Unspecified abdominal pain: Secondary | ICD-10-CM

## 2021-09-14 DIAGNOSIS — Z23 Encounter for immunization: Secondary | ICD-10-CM | POA: Diagnosis not present

## 2021-09-26 ENCOUNTER — Ambulatory Visit (HOSPITAL_COMMUNITY)
Admission: RE | Admit: 2021-09-26 | Discharge: 2021-09-26 | Disposition: A | Discharge status: Home / Self Care | Payer: BC Managed Care – PPO | Source: Ambulatory Visit | Attending: Internal Medicine | Admitting: Internal Medicine

## 2021-09-26 DIAGNOSIS — R109 Unspecified abdominal pain: Secondary | ICD-10-CM | POA: Insufficient documentation

## 2021-09-26 DIAGNOSIS — R1011 Right upper quadrant pain: Secondary | ICD-10-CM | POA: Diagnosis not present

## 2021-10-09 ENCOUNTER — Encounter: Payer: Self-pay | Admitting: Internal Medicine

## 2021-10-12 DIAGNOSIS — E782 Mixed hyperlipidemia: Secondary | ICD-10-CM | POA: Diagnosis not present

## 2021-10-12 DIAGNOSIS — R7301 Impaired fasting glucose: Secondary | ICD-10-CM | POA: Diagnosis not present

## 2021-10-12 DIAGNOSIS — M069 Rheumatoid arthritis, unspecified: Secondary | ICD-10-CM | POA: Diagnosis not present

## 2021-10-12 DIAGNOSIS — R109 Unspecified abdominal pain: Secondary | ICD-10-CM | POA: Diagnosis not present

## 2021-10-23 DIAGNOSIS — L720 Epidermal cyst: Secondary | ICD-10-CM | POA: Diagnosis not present

## 2021-10-23 DIAGNOSIS — L820 Inflamed seborrheic keratosis: Secondary | ICD-10-CM | POA: Diagnosis not present

## 2021-10-31 NOTE — Progress Notes (Unsigned)
Referring Provider: Benita Stabile, MD Primary Care Physician:  Benita Stabile, MD Primary GI Physician: Dr. Marletta Lor  No chief complaint on file.   HPI:   Gary Woods is a 51 y.o. male presenting today at the request of Dr. Margo Aye for abdominal pain.  We only seen patient at the time of screening colonoscopy completed 02/14/2021 which revealed nonbleeding internal hemorrhoids, one 5 mm tubular adenoma.  Recommended 5-year surveillance.  Reviewed data available in [facility]. Appears he had an abdominal ultrasound on 09/26/2021, but unable to review results. Labs 09/14/2021: No significant abnormalities on CBC, CMP.  Sed rate and CRP within normal limits.  Today:    Past Medical History:  Diagnosis Date   Anxiety    Collagen vascular disease (HCC)    COPD (chronic obstructive pulmonary disease) (HCC)    Depression    Rheumatoid arthritis (HCC)     Past Surgical History:  Procedure Laterality Date   BACK SURGERY     COLONOSCOPY WITH PROPOFOL N/A 02/14/2021   Procedure: COLONOSCOPY WITH PROPOFOL;  Surgeon: Lanelle Bal, DO;  [facility];  Service: Endoscopy;  Laterality: N/A;  12:00 / ASA 2   POLYPECTOMY  02/14/2021   Procedure: POLYPECTOMY;  Surgeon: Lanelle Bal, DO;  Location: [facility];  Service: Endoscopy;;    Current Outpatient Medications  Medication Sig Dispense Refill   Adalimumab (HUMIRA) 40 MG/0.8ML PSKT Inject 40 mg into the muscle every Saturday.     ALPRAZolam (XANAX) 1 MG tablet Take 1 mg by mouth 2 (two) times daily as needed for anxiety.  4   atorvastatin (LIPITOR) 20 MG tablet Take 20 mg by mouth daily.     cyclobenzaprine (FLEXERIL) 10 MG tablet Take 1 tablet (10 mg total) by mouth at bedtime. (Patient taking differently: Take 10 mg by mouth 3 (three) times daily as needed for muscle spasms.) 15 tablet 0   pantoprazole (PROTONIX) 40 MG tablet Take 40 mg by mouth daily.     PARoxetine (PAXIL) 20 MG tablet Take 20 mg by mouth daily.      No current facility-administered medications for this visit.    Allergies as of 11/02/2021   (No Known Allergies)    Family History  Problem Relation Age of Onset   CAD Neg Hx     Social History   Socioeconomic History   Marital status: Divorced    Spouse name: Not on file   Number of children: Not on file   Years of education: Not on file   Highest education level: Not on file  Occupational History   Occupation: Holiday representative  Tobacco Use   Smoking status: Every Day    Packs/day: 1.00    Years: 25.00    Total pack years: 25.00    Types: Cigarettes   Smokeless tobacco: Never  Vaping Use   Vaping Use: Never used  Substance and Sexual Activity   Alcohol use: No   Drug use: No   Sexual activity: Yes  Other Topics Concern   Not on file  Social History Narrative   Not on file   Social Determinants of Health   Financial Resource Strain: Not on file  Food Insecurity: Not on file  Transportation Needs: Not on file  Physical Activity: Not on file  Stress: Not on file  Social Connections: Not on file    Review of Systems: Gen: Denies fever, chills, cold or flu like symptoms, pre-syncope, or syncope.  CV: Denies chest  pain, palpitations. Resp: Denies dyspnea at rest, cough. GI: See HPI Heme: See HPI  Physical Exam: There were no vitals taken for this visit. General:   Alert and oriented. No distress noted. Pleasant and cooperative.  Head:  Normocephalic and atraumatic. Eyes:  Conjuctiva clear without scleral icterus. Heart:  S1, S2 present without murmurs appreciated. Lungs:  Clear to auscultation bilaterally. No wheezes, rales, or rhonchi. No distress.  Abdomen:  +BS, soft, non-tender and non-distended. No rebound or guarding. No HSM or masses noted. Msk:  Symmetrical without gross deformities. Normal posture. Extremities:  Without edema. Neurologic:  Alert and  oriented x4 Psych:  Normal mood and affect.    Assessment:     Plan:  ***   Ermalinda Memos, PA-C [facility] 11/02/2021

## 2021-10-31 NOTE — H&P (View-Only) (Signed)
Referring Provider: Celene Squibb, MD Primary Care Physician:  Celene Squibb, MD Primary GI Physician: Dr. Abbey Chatters  Chief Complaint  Patient presents with   Abdominal Pain    Pain on right upper side. Sometimes constipated has a lot of gas. Has been having some incontinents of stools. Some acid reflux. Having incomplete BM's    HPI:   Gary Woods is a 51 y.o. male presenting today at the request of Dr. Nevada Crane for abdominal pain.  We have only seen patient at the time of screening colonoscopy completed 02/14/2021 which revealed nonbleeding internal hemorrhoids, one 5 mm tubular adenoma.  Recommended 5-year surveillance.  Office visit with PCP 09/14/2021.  Patient reported generalized fatigue, abdominal pain/discomfort, flatulence, loose stools x3 to 4 weeks.  Denied nausea, vomiting, constipation, blood in the stools, aggravating or relieving factors.  Abdominal exam is benign.  Plan to check abdominal ultrasound and update labs.  Labs 09/14/2021: No significant abnormalities on CBC, CMP.  Sed rate and CRP within normal limits.  RUQ [place] 09/26/21: Normal exam.   Today:  RUQ abdominal pain along his rub over the last couple of months. Not all the time. 1-2 times a day. Can be triggered by greasy foods but also non-greasy foods. Nagging. Had some sharp pain last night with some pain in his right back as well. Can last a couple hours then improve. Not affected by activity. Chronic reflux on pantoprazole 40 mg daily. Used to be on omeprazole. Thinks he is having a tickle in his throat after eating and starts coughing. No typical heartburn symptoms.  No dysphagia.  Ibuprofen 400-600 mg 2-3 times a week.   Reflux has been present for many years. No prior EGD.   Reports having some dark stools intermittently, but not black. Reports this isn't necessarily new. No iron or pepto bismol.   Bowel movements:  Irregular BMs for years. Constipation at times. Incomplete BMs. Intermittent anal seepage.  Rectal burning. Taking OTC laxative once every month or so.  Normally a small BM each morning, and other times will skip 2-3 days. Occasional mushy BM, just 1 a day. Lower abdominal discomfort related to bowels. Bloating.   Sits on toilet for 30 minutes. Straining.   Has some hemorrhoid cream that he has been using for the last 2-3 weeks prescribed by PCP. This helps with rectal burning.    Past Medical History:  Diagnosis Date   Anxiety    Collagen vascular disease (HCC)    COPD (chronic obstructive pulmonary disease) ([place])    Depression    GERD (gastroesophageal reflux disease)    Rheumatoid arthritis ([place])     Past Surgical History:  Procedure Laterality Date   BACK SURGERY     COLONOSCOPY WITH PROPOFOL N/A 02/14/2021   Surgeon: Hurshel Keys K, DO;  nonbleeding internal hemorrhoids, one 5 mm tubular adenoma.  Recommended 5-year surveillance.   POLYPECTOMY  02/14/2021   Procedure: POLYPECTOMY;  Surgeon: Eloise Harman, DO;  [facility];  Service: Endoscopy;;    Current Outpatient Medications  Medication Sig Dispense Refill   Adalimumab (HUMIRA) 40 MG/0.8ML PSKT Inject 40 mg into the muscle every Saturday.     ALPRAZolam (XANAX) 1 MG tablet Take 1 mg by mouth 2 (two) times daily as needed for anxiety.  4   atorvastatin (LIPITOR) 20 MG tablet Take 20 mg by mouth daily.     pantoprazole (PROTONIX) 40 MG tablet Take 1 tablet (40 mg total) by mouth 2 (  two) times daily before a meal. 60 tablet 3   PARoxetine (PAXIL) 20 MG tablet Take 20 mg by mouth daily.     cyclobenzaprine (FLEXERIL) 10 MG tablet Take 1 tablet (10 mg total) by mouth at bedtime. (Patient not taking: Reported on 11/02/2021) 15 tablet 0   No current facility-administered medications for this visit.    Allergies as of 11/02/2021   (No Known Allergies)    Family History  Problem Relation Age of Onset   CAD Neg Hx     Social History   Socioeconomic History   Marital status: Divorced     Spouse name: Not on file   Number of children: Not on file   Years of education: Not on file   Highest education level: Not on file  Occupational History   Occupation: Architect  Tobacco Use   Smoking status: Every Day    Packs/day: 1.00    Years: 25.00    Total pack years: 25.00    Types: Cigarettes   Smokeless tobacco: Never  Vaping Use   Vaping Use: Never used  Substance and Sexual Activity   Alcohol use: No   Drug use: No   Sexual activity: Yes  Other Topics Concern   Not on file  Social History Narrative   Not on file   Social Determinants of Health   Financial Resource Strain: Not on file  Food Insecurity: Not on file  Transportation Needs: Not on file  Physical Activity: Not on file  Stress: Not on file  Social Connections: Not on file    Review of Systems: Gen: Denies fever, chills, cold or flu like symptoms, pre-syncope, or syncope.  CV: Denies chest pain, palpitations. Resp: Denies dyspnea at rest, cough. GI: See HPI Heme: See HPI  Physical Exam: BP 122/74 (BP Location: Right Arm, Patient Position: Sitting, Cuff Size: Normal)   Pulse 69   Temp 98.2 F (36.8 C) (Temporal)   Ht 6' (1.829 m)   Wt 209 lb (94.8 kg)   SpO2 98%   BMI 28.35 kg/m  General:   Alert and oriented. No distress noted. Pleasant and cooperative.  Head:  Normocephalic and atraumatic. Eyes:  Conjuctiva clear without scleral icterus. Heart:  S1, S2 present without murmurs appreciated. Lungs:  Clear to auscultation bilaterally. No wheezes, rales, or rhonchi. No distress.  Abdomen:  +BS, soft, non-tender and non-distended. No rebound or guarding. No HSM or masses noted. Msk:  Symmetrical without gross deformities. Normal posture. Extremities:  Without edema. Neurologic:  Alert and  oriented x4 Psych:  Normal mood and affect.    Assessment:  50 y.o. male with history of anxiety/depression, COPD, RA, GERD, adenomatous colon polyps due for surveillance in 2028, presenting today  at the request of Dr. Nevada Crane for further evaluation of abdominal pain. Also discussed chronic GERD, chronic constipation, and rectal burning.   RUQ abdominal pain: New onset and pain, but daily RUQ abdominal pain a couple months ago.  Typically nagging, but had some sharp pain last night that radiated to his back.  Can be triggered by fatty meals, but also by bland foods at times.  RUQ ultrasound in September with normal exam.  CBC and CMP within normal limits.  He does have history of chronic GERD on Protonix 40 mg daily and has also been taking ibuprofen 400-600 mg 2-3 times a week chronically. Reports intermittent dark, but not black stools. Abdominal exam is benign today.  Query whether he has gastritis, duodenitis, PUD, H. pylori.  I have recommended EGD for further evaluation as well as increasing PPI to twice daily.   GERD:  Chronic. Has been on pantoprazole 40 mg for quite some time and reports symptoms are fairly well controlled. After eating, he gets a tickle in the back of his throat that causes him to cough. Not sure if this is reflux related. No specific food triggers. No dysphagia. We will try increasing his PPI to see if makes a difference for him. Also proceeding with an EGD due to RUQ pain as discussed above and need for Barrett's esophagus screening.   Chronic constipation:  Patient reports irregular bowel movements for years, primarily with constipation.  Intermittent loose stools or anal seepage, likely overflow.  Associated lower abdominal pain and bloating that will improve if he has a good bowel movement.  May have a component of IBS.  As he has never tried any medications on a daily basis to help with bowel regularity, we will start him on MiraLAX daily and see how he does.  If no significant improvement, we will try Linzess.  Rectal burning:  Likely secondary to known hemorrhoids in the setting of constipation. No alarm symptoms and recent colonoscopy in January 2023.  We will  continue supportive measures and focus on better control of constipation.    Plan:  Proceed with upper endoscopy with propofol by Dr. Abbey Chatters in near future. The risks, benefits, and alternatives have been discussed with the patient in detail. The patient states understanding and desires to proceed.  ASA 2 Increase pantoprazole to 40 mg twice daily 30 minutes before breakfast and dinner. Reinforced GERD diet/lifestyle.  Separate written instructions provided on AVS. Avoid all NSAIDs. Use Tylenol first for pain.  No more than 3000 mg of Tylenol per 24 hours. Start MiraLAX 17 g daily.  Patient will let me know if MiraLAX is not helpful for his constipation. Continue use hemorrhoid cream twice daily as needed. Sitz bath's twice daily. Tucks pads when wiping. Limit toilet time to 2-3 minutes. Avoid straining. Follow-up after EGD.   Aliene Altes, PA-C [facility] 11/02/2021

## 2021-11-02 ENCOUNTER — Encounter: Payer: Self-pay | Admitting: *Deleted

## 2021-11-02 ENCOUNTER — Encounter: Payer: Self-pay | Admitting: Gastroenterology

## 2021-11-02 ENCOUNTER — Ambulatory Visit (INDEPENDENT_AMBULATORY_CARE_PROVIDER_SITE_OTHER): Payer: BC Managed Care – PPO | Admitting: Gastroenterology

## 2021-11-02 VITALS — BP 122/74 | HR 69 | Temp 98.2°F | Ht 72.0 in | Wt 209.0 lb

## 2021-11-02 DIAGNOSIS — R1011 Right upper quadrant pain: Secondary | ICD-10-CM

## 2021-11-02 DIAGNOSIS — K5900 Constipation, unspecified: Secondary | ICD-10-CM | POA: Diagnosis not present

## 2021-11-02 DIAGNOSIS — R208 Other disturbances of skin sensation: Secondary | ICD-10-CM | POA: Diagnosis not present

## 2021-11-02 DIAGNOSIS — K219 Gastro-esophageal reflux disease without esophagitis: Secondary | ICD-10-CM | POA: Diagnosis not present

## 2021-11-02 MED ORDER — PANTOPRAZOLE SODIUM 40 MG PO TBEC: 40.0000 mg | DELAYED_RELEASE_TABLET | Freq: Two times a day (BID) | ORAL | 3 refills | Status: DC

## 2021-11-02 NOTE — Patient Instructions (Addendum)
We will arrange for you to have an upper endoscopy in the near future with Dr. Abbey Chatters.  Increase pantoprazole to 40 mg twice daily 30 minutes before breakfast and dinner.  I am sending in a new prescription to your pharmacy.  Follow a GERD diet:  Avoid fried, fatty, greasy, spicy, citrus foods. Avoid caffeine and carbonated beverages. Avoid chocolate. Try eating 4-6 small meals a day rather than 3 large meals. Do not eat within 3 hours of laying down. Prop head of bed up on wood or bricks to create a 6 inch incline.  Avoid all NSAID products including ibuprofen, Aleve, Advil, BC powders, Goody powders, and anything that says "NSAID" on the package.  Use Tylenol for pain.  No more than 3000 mg of Tylenol per 24 hours.  For constipation: Start MiraLAX 1 capful (17 g) daily in 8 ounces of water. You may experience some diarrhea initially, but I suspect this will taper off once your bowels start moving routinely. If MiraLAX is not helpful after couple of weeks, please let me know.  For rectal burning: I suspect this is secondary to hemorrhoids. Continue to use your hemorrhoid cream twice daily as needed. You can also do sitz bath's twice daily. Use Tucks pads when wiping. Limit toilet time to 2-3 minutes. Avoid straining.  We will plan to see you back in the office after your upper endoscopy.  Do not hesitate to call sooner if you have questions or concerns.  It was very nice to meet you today!  Aliene Altes, PA-C [facility]

## 2021-11-17 ENCOUNTER — Ambulatory Visit (HOSPITAL_COMMUNITY): Payer: BC Managed Care – PPO | Admitting: Anesthesiology

## 2021-11-17 ENCOUNTER — Other Ambulatory Visit: Payer: Self-pay

## 2021-11-17 ENCOUNTER — Encounter (HOSPITAL_COMMUNITY)
Admission: RE | Disposition: A | Discharge status: Home / Self Care | Payer: Self-pay | Source: Home / Self Care | Attending: Internal Medicine

## 2021-11-17 ENCOUNTER — Encounter (HOSPITAL_COMMUNITY): Payer: Self-pay

## 2021-11-17 ENCOUNTER — Ambulatory Visit (HOSPITAL_COMMUNITY)
Admission: RE | Admit: 2021-11-17 | Discharge: 2021-11-17 | Disposition: A | Discharge status: Home / Self Care | Payer: BC Managed Care – PPO | Attending: Internal Medicine | Admitting: Internal Medicine

## 2021-11-17 DIAGNOSIS — F32A Depression, unspecified: Secondary | ICD-10-CM | POA: Diagnosis not present

## 2021-11-17 DIAGNOSIS — M069 Rheumatoid arthritis, unspecified: Secondary | ICD-10-CM | POA: Insufficient documentation

## 2021-11-17 DIAGNOSIS — K219 Gastro-esophageal reflux disease without esophagitis: Secondary | ICD-10-CM | POA: Insufficient documentation

## 2021-11-17 DIAGNOSIS — R1011 Right upper quadrant pain: Secondary | ICD-10-CM | POA: Diagnosis not present

## 2021-11-17 DIAGNOSIS — J449 Chronic obstructive pulmonary disease, unspecified: Secondary | ICD-10-CM | POA: Diagnosis not present

## 2021-11-17 DIAGNOSIS — F419 Anxiety disorder, unspecified: Secondary | ICD-10-CM | POA: Diagnosis not present

## 2021-11-17 DIAGNOSIS — K2970 Gastritis, unspecified, without bleeding: Secondary | ICD-10-CM | POA: Diagnosis not present

## 2021-11-17 DIAGNOSIS — F17210 Nicotine dependence, cigarettes, uncomplicated: Secondary | ICD-10-CM | POA: Insufficient documentation

## 2021-11-17 HISTORY — PX: ESOPHAGOGASTRODUODENOSCOPY (EGD) WITH PROPOFOL: SHX5813

## 2021-11-17 HISTORY — PX: BIOPSY: SHX5522

## 2021-11-17 SURGERY — ESOPHAGOGASTRODUODENOSCOPY (EGD) WITH PROPOFOL
Anesthesia: General

## 2021-11-17 MED ORDER — PROPOFOL 10 MG/ML IV BOLUS
INTRAVENOUS | Status: DC | PRN
  Administered 2021-11-17: 120 mg via INTRAVENOUS
  Administered 2021-11-17: 80 mg via INTRAVENOUS

## 2021-11-17 MED ORDER — LINACLOTIDE 145 MCG PO CAPS: 145.0000 ug | ORAL_CAPSULE | Freq: Every day | ORAL | 3 refills | Status: AC

## 2021-11-17 MED ORDER — LACTATED RINGERS IV SOLN
INTRAVENOUS | Status: DC
  Administered 2021-11-17: 1000 mL via INTRAVENOUS

## 2021-11-17 MED ORDER — LIDOCAINE HCL (CARDIAC) PF 100 MG/5ML IV SOSY
PREFILLED_SYRINGE | INTRAVENOUS | Status: DC | PRN
  Administered 2021-11-17: 50 mg via INTRAVENOUS

## 2021-11-17 NOTE — Anesthesia Postprocedure Evaluation (Signed)
Anesthesia Post Note  Patient: Gary Woods  Procedure(s) Performed: ESOPHAGOGASTRODUODENOSCOPY (EGD) WITH PROPOFOL BIOPSY  Patient location during evaluation: Phase II Anesthesia Type: General Level of consciousness: awake and alert and oriented Pain management: pain level controlled Vital Signs Assessment: post-procedure vital signs reviewed and stable Respiratory status: spontaneous breathing, nonlabored ventilation and respiratory function stable Cardiovascular status: blood pressure returned to baseline and stable Postop Assessment: no apparent nausea or vomiting Anesthetic complications: no   No notable events documented.   Last Vitals:  Vitals:   11/17/21 1119 11/17/21 1152  BP: 134/84 102/66  Pulse: (!) 54 (!) 58  Resp: 13 (!) 21  Temp: 36.7 C 36.8 C  SpO2: 99% 95%    Last Pain:  Vitals:   11/17/21 1152  TempSrc: Oral  PainSc: 0-No pain                 Suprena Travaglini C Shane Badeaux

## 2021-11-17 NOTE — Discharge Instructions (Addendum)
EGD Discharge instructions Please read the instructions outlined below and refer to this sheet in the next few weeks. These discharge instructions provide you with general information on caring for yourself after you leave the hospital. Your doctor may also give you specific instructions. While your treatment has been planned according to the most current medical practices available, unavoidable complications occasionally occur. If you have any problems or questions after discharge, please call your doctor. ACTIVITY You may resume your regular activity but move at a slower pace for the next 24 hours.  Take frequent rest periods for the next 24 hours.  Walking will help expel (get rid of) the air and reduce the bloated feeling in your abdomen.  No driving for 24 hours (because of the anesthesia (medicine) used during the test).  You may shower.  Do not sign any important legal documents or operate any machinery for 24 hours (because of the anesthesia used during the test).  NUTRITION Drink plenty of fluids.  You may resume your normal diet.  Begin with a light meal and progress to your normal diet.  Avoid alcoholic beverages for 24 hours or as instructed by your caregiver.  MEDICATIONS You may resume your normal medications unless your caregiver tells you otherwise.  WHAT YOU CAN EXPECT TODAY You may experience abdominal discomfort such as a feeling of fullness or "gas" pains.  FOLLOW-UP Your doctor will discuss the results of your test with you.  SEEK IMMEDIATE MEDICAL ATTENTION IF ANY OF THE FOLLOWING OCCUR: Excessive nausea (feeling sick to your stomach) and/or vomiting.  Severe abdominal pain and distention (swelling).  Trouble swallowing.  Temperature over 101 F (37.8 C).  Rectal bleeding or vomiting of blood.    Your EGD revealed mild amount inflammation in your stomach.  I took biopsies of this to rule out infection with a bacteria called H. pylori.  Await pathology results, my  office will contact you.  Continue on pantoprazole 40 mg twice daily.  Limit NSAID use.  I will send in prescription for Linzess 145 mcg daily.  We have room to increase or decrease this dose depending on how you respond.  Linzess works best when taken once a day every day, on an empty stomach, at least 30 minutes before your first meal of the day.  When Linzess is taken daily as directed:  *Constipation relief is typically felt in about a week *IBS-C patients may begin to experience relief from belly pain and overall abdominal symptoms (pain, discomfort, and bloating) in about 1 week,   with symptoms typically improving over 12 weeks.  Diarrhea may occur in the first 2 weeks -keep taking it.  The diarrhea should go away and you should start having normal, complete, full bowel movements. It may be helpful to start treatment when you can be near the comfort of your own bathroom, such as a weekend.   Follow up with GI in 3 months  I hope you have a great rest of your week!  Elon Alas. Abbey Chatters, D.O. Gastroenterology and Hepatology [facility]

## 2021-11-17 NOTE — Transfer of Care (Signed)
Immediate Anesthesia Transfer of Care Note  Patient: Gary Woods  Procedure(s) Performed: ESOPHAGOGASTRODUODENOSCOPY (EGD) WITH PROPOFOL  Patient Location: Endoscopy Unit  Anesthesia Type:General  Level of Consciousness: drowsy  Airway & Oxygen Therapy: Patient Spontanous Breathing  Post-op Assessment: Report given to RN and Post -op Vital signs reviewed and stable  Post vital signs: Reviewed and stable  Last Vitals:  Vitals Value Taken Time  BP 102/66 11/17/21 1152  Temp 36.8 C 11/17/21 1152  Pulse 58 11/17/21 1152  Resp 21 11/17/21 1152  SpO2 95 % 11/17/21 1152    Last Pain:  Vitals:   11/17/21 1152  TempSrc: Oral  PainSc: 0-No pain      Patients Stated Pain Goal: 8 (34/28/76 8115)  Complications: No notable events documented.

## 2021-11-17 NOTE — Interval H&P Note (Signed)
History and Physical Interval Note:  11/17/2021 11:11 AM  De Blanch  has presented today for surgery, with the diagnosis of ruq PAIN. GERD.  The various methods of treatment have been discussed with the patient and family. After consideration of risks, benefits and other options for treatment, the patient has consented to  Procedure(s) with comments: ESOPHAGOGASTRODUODENOSCOPY (EGD) WITH PROPOFOL (N/A) - 12:15pm, asa 2 as a surgical intervention.  The patient's history has been reviewed, patient examined, no change in status, stable for surgery.  I have reviewed the patient's chart and labs.  Questions were answered to the patient's satisfaction.     Gary Woods

## 2021-11-17 NOTE — Op Note (Signed)
[facility] Patient Name: Gary Woods Procedure Date: 11/17/2021 11:22 AM MRN: [id number] Date of Birth: [birth year] Attending MD: Elon Alas. Abbey Chatters , [place], [id number] CSN: [id number] Age: 51 Admit Type: Outpatient Procedure:                Upper GI endoscopy Indications:              Abdominal pain in the right upper quadrant,                            Heartburn Providers:                Elon Alas. Abbey Chatters, DO, [place]adoris Gene Technician,                            Technician, Lambert Mody Referring MD:              Medicines:                See the Anesthesia note for documentation of the                            administered medications Complications:            No immediate complications. Estimated Blood Loss:     Estimated blood loss was minimal. Procedure:                Pre-Anesthesia Assessment:                           - The anesthesia plan was to use monitored                            anesthesia care (MAC).                           After obtaining informed consent, the endoscope was                            passed under direct vision. Throughout the                            procedure, the patient's blood pressure, pulse, and                            oxygen saturations were monitored continuously. The                            [id number]-H190 ([id number]) scope was introduced through the                            mouth, and advanced to the second part of duodenum.                            The upper GI endoscopy was accomplished without                            difficulty. The patient tolerated the  procedure                            well. Scope In: 11:46:03 AM Scope Out: 11:49:07 AM Total Procedure Duration: 0 hours 3 minutes 4 seconds  Findings:      The Z-line was regular and was found 40 cm from the incisors.      There is no endoscopic evidence of areas of erosion, esophagitis,       ulcerations or varices in the entire esophagus.      Patchy mild  inflammation characterized by erythema was found in the       stomach. Biopsies were taken with a cold forceps for Helicobacter pylori       testing.      The duodenal bulb, first portion of the duodenum and second portion of       the duodenum were normal. Impression:               - Z-line regular, 40 cm from the incisors.                           - Gastritis. Biopsied.                           - Normal duodenal bulb, first portion of the                            duodenum and second portion of the duodenum. Moderate Sedation:      Per Anesthesia Care Recommendation:           - Patient has a contact number available for                            emergencies. The signs and symptoms of potential                            delayed complications were discussed with the                            patient. Return to normal activities tomorrow.                            Written discharge instructions were provided to the                            patient.                           - Resume previous diet.                           - Continue present medications.                           - Await pathology results.                           - Use Protonix (pantoprazole) 40 mg PO BID.                           -  No ibuprofen, naproxen, or other non-steroidal                            anti-inflammatory drugs.                           - Start Linzess 145 mcg daily Procedure Code(s):        --- Professional ---                           [phone], Esophagogastroduodenoscopy, flexible,                            transoral; with biopsy, single or multiple Diagnosis Code(s):        --- Professional ---                           K29.70, Gastritis, unspecified, without bleeding                           R10.11, Right upper quadrant pain                           R12, Heartburn CPT copyright 2022 American Medical Association. All rights reserved. The codes documented in this report are  preliminary and upon coder review may  be revised to meet current compliance requirements. Elon Alas. Abbey Chatters, DO [place] Abbey Chatters, DO 11/17/2021 11:52:43 AM This report has been signed electronically. Number of Addenda: 0

## 2021-11-17 NOTE — Anesthesia Preprocedure Evaluation (Addendum)
Anesthesia Evaluation  Patient identified by MRN, date of birth, ID band Patient awake    Reviewed: Allergy & Precautions, NPO status , Patient's Chart, lab work & pertinent test results  Airway Mallampati: II  TM Distance: >3 FB Neck ROM: Full    Dental  (+) Dental Advisory Given, Teeth Intact   Pulmonary COPD, Current SmokerPatient did not abstain from smoking.   Pulmonary exam normal breath sounds clear to auscultation       Cardiovascular negative cardio ROS Normal cardiovascular exam Rhythm:Regular Rate:Normal     Neuro/Psych  PSYCHIATRIC DISORDERS Anxiety Depression    negative neurological ROS     GI/Hepatic Neg liver ROS,GERD  Medicated and Controlled,,  Endo/Other  negative endocrine ROS    Renal/GU negative Renal ROS  negative genitourinary   Musculoskeletal  (+) Arthritis , Rheumatoid disorders,    Abdominal   Peds negative pediatric ROS (+)  Hematology negative hematology ROS (+)   Anesthesia Other Findings   Reproductive/Obstetrics negative OB ROS                             Anesthesia Physical Anesthesia Plan  ASA: 2  Anesthesia Plan: General   Post-op Pain Management: Minimal or no pain anticipated   Induction: Intravenous  PONV Risk Score and Plan: TIVA  Airway Management Planned: Nasal Cannula and Natural Airway  Additional Equipment:   Intra-op Plan:   Post-operative Plan:   Informed Consent: I have reviewed the patients History and Physical, chart, labs and discussed the procedure including the risks, benefits and alternatives for the proposed anesthesia with the patient or authorized representative who has indicated his/her understanding and acceptance.     Dental advisory given  Plan Discussed with: CRNA and Surgeon  Anesthesia Plan Comments:         Anesthesia Quick Evaluation

## 2021-11-20 ENCOUNTER — Telehealth: Payer: Self-pay | Admitting: *Deleted

## 2021-11-20 LAB — SURGICAL PATHOLOGY

## 2021-11-20 NOTE — Telephone Encounter (Signed)
error 

## 2021-11-24 ENCOUNTER — Telehealth: Payer: Self-pay | Admitting: *Deleted

## 2021-11-24 NOTE — Telephone Encounter (Signed)
FYI routing to you in absence of [place], [place]. Received denial letter for Linzess 173mg. Pt needs to try senna, docusate, sodium phosphate and lactulose. Please advise.

## 2021-11-24 NOTE — Telephone Encounter (Signed)
Spoke to pt, he informed me that he was feeling much better. He states he has been having regular bowel movements. He said the MiraLax was working. He states he will call in a couple weeks with an update on how he is doing.

## 2021-11-27 ENCOUNTER — Encounter (HOSPITAL_COMMUNITY): Payer: Self-pay | Admitting: Internal Medicine

## 2021-11-29 DIAGNOSIS — R5383 Other fatigue: Secondary | ICD-10-CM | POA: Diagnosis not present

## 2021-11-29 DIAGNOSIS — Z79899 Other long term (current) drug therapy: Secondary | ICD-10-CM | POA: Diagnosis not present

## 2021-11-29 DIAGNOSIS — M1991 Primary osteoarthritis, unspecified site: Secondary | ICD-10-CM | POA: Diagnosis not present

## 2021-11-29 DIAGNOSIS — M0589 Other rheumatoid arthritis with rheumatoid factor of multiple sites: Secondary | ICD-10-CM | POA: Diagnosis not present

## 2021-12-25 DIAGNOSIS — H6123 Impacted cerumen, bilateral: Secondary | ICD-10-CM | POA: Diagnosis not present

## 2022-02-08 DIAGNOSIS — H9192 Unspecified hearing loss, left ear: Secondary | ICD-10-CM | POA: Diagnosis not present

## 2022-02-08 DIAGNOSIS — H9312 Tinnitus, left ear: Secondary | ICD-10-CM | POA: Diagnosis not present

## 2022-02-22 NOTE — Progress Notes (Deleted)
Referring Provider: Celene Squibb, MD Primary Care Physician:  Celene Squibb, MD Primary GI Physician: Dr. Abbey Chatters  No chief complaint on file.   HPI:   Gary Woods is a 52 y.o. male with history of anxiety/depression, COPD, RA, GERD, adenomatous colon polyps due for surveillance in 2028, presenting today  presenting today for follow-up.  Last seen in our office 11/02/2020 reporting right upper quadrant abdominal pain a couple times a day triggered by greasy and occasional and greasy foods for the last couple of months.  Pain last couple of hours at a time.  Unaffected by activity.  Chronic reflux fairly well-controlled on pantoprazole daily.  Used to be on omeprazole.  Taking ibuprofen 2-3 times a week.  Also with chronic history of occasional constipation, incomplete bowel movements, intermittent anal seepage, rectal burning.  States lower abdominal discomfort related to bowels.  Also history of rectal burning and using hemorrhoid cream.  Had RUQ ultrasound in September that was normal.  Recent labs including LFTs also normal.  Recommended EGD, increase PPI to twice daily, MiraLAX daily, consider Linzess if needed.  EGD 12/14/2021: Gastritis biopsied. Pathology benign.   Telephone encounter 11/24/2021.  Insurance denied Linzess stating that he needed to try senna, docusate sodium, lactulose.  Patient stated MiraLAX was actually working and he did not need Linzess or other laxative at that time.  Today:    Past Medical History:  Diagnosis Date   Anxiety    Collagen vascular disease ([place])    COPD (chronic obstructive pulmonary disease) ([place])    Depression    GERD (gastroesophageal reflux disease)    Rheumatoid arthritis ([place])     Past Surgical History:  Procedure Laterality Date   BACK SURGERY     BIOPSY  11/17/2021   Procedure: BIOPSY;  Surgeon: Eloise Harman, DO;  Location: [facility];  Service: Endoscopy;;   COLONOSCOPY WITH PROPOFOL N/A 02/14/2021   Surgeon:  Hurshel Keys K, DO;  nonbleeding internal hemorrhoids, one 5 mm tubular adenoma.  Recommended 5-year surveillance.   ESOPHAGOGASTRODUODENOSCOPY (EGD) WITH PROPOFOL N/A 11/17/2021   Procedure: ESOPHAGOGASTRODUODENOSCOPY (EGD) WITH PROPOFOL;  Surgeon: Eloise Harman, DO;  Location: [facility];  Service: Endoscopy;  Laterality: N/A;  12:15pm, asa 2   POLYPECTOMY  02/14/2021   Procedure: POLYPECTOMY;  Surgeon: Eloise Harman, DO;  Location: [facility];  Service: Endoscopy;;    Current Outpatient Medications  Medication Sig Dispense Refill   Adalimumab (HUMIRA) 40 MG/0.8ML PSKT Inject 40 mg into the muscle every Saturday. week     ALPRAZolam (XANAX) 1 MG tablet Take 1 mg by mouth 2 (two) times daily as needed for anxiety.  4   atorvastatin (LIPITOR) 20 MG tablet Take 20 mg by mouth daily.     cyclobenzaprine (FLEXERIL) 10 MG tablet Take 1 tablet (10 mg total) by mouth at bedtime. (Patient not taking: Reported on 11/02/2021) 15 tablet 0   linaclotide (LINZESS) 145 MCG CAPS capsule Take 1 capsule (145 mcg total) by mouth daily before breakfast. 90 capsule 3   pantoprazole (PROTONIX) 40 MG tablet Take 1 tablet (40 mg total) by mouth 2 (two) times daily before a meal. 60 tablet 3   PARoxetine (PAXIL) 20 MG tablet Take 20 mg by mouth daily.     predniSONE (DELTASONE) 10 MG tablet Take 10 mg by mouth daily with breakfast. taper     No current facility-administered medications for this visit.    Allergies as of 02/23/2022   (  No Known Allergies)    Family History  Problem Relation Age of Onset   CAD Neg Hx     Social History   Socioeconomic History   Marital status: Divorced    Spouse name: Not on file   Number of children: Not on file   Years of education: Not on file   Highest education level: Not on file  Occupational History   Occupation: Architect  Tobacco Use   Smoking status: Every Day    Packs/day: 1.00    Years: 25.00    Total pack years: 25.00    Types:  Cigarettes   Smokeless tobacco: Never  Vaping Use   Vaping Use: Never used  Substance and Sexual Activity   Alcohol use: No   Drug use: No   Sexual activity: Yes  Other Topics Concern   Not on file  Social History Narrative   Not on file   Social Determinants of Health   Financial Resource Strain: Not on file  Food Insecurity: Not on file  Transportation Needs: Not on file  Physical Activity: Not on file  Stress: Not on file  Social Connections: Not on file    Review of Systems: Gen: Denies fever, chills, cold or flu like symptoms, pre-syncope, or syncope.   CV: Denies chest pain, palpitations. Resp: Denies dyspnea at rest or cough.  GI: See HPI Heme: See HPI  Physical Exam: There were no vitals taken for this visit. General:   Alert and oriented. No distress noted. Pleasant and cooperative.  Head:  Normocephalic and atraumatic. Eyes:  Conjuctiva clear without scleral icterus. Heart:  S1, S2 present without murmurs appreciated. Lungs:  Clear to auscultation bilaterally. No wheezes, rales, or rhonchi. No distress.  Abdomen:  +BS, soft, non-tender and non-distended. No rebound or guarding. No HSM or masses noted. Msk:  Symmetrical without gross deformities. Normal posture. Extremities:  Without edema. Neurologic:  Alert and  oriented x4 Psych:  Normal mood and affect.    Assessment:     Plan:  ***   Aliene Altes, PA-C [facility] 02/23/2022

## 2022-02-23 ENCOUNTER — Ambulatory Visit: Payer: BC Managed Care – PPO | Admitting: Gastroenterology

## 2022-03-04 ENCOUNTER — Encounter (INDEPENDENT_AMBULATORY_CARE_PROVIDER_SITE_OTHER): Payer: Self-pay

## 2022-03-05 ENCOUNTER — Ambulatory Visit (INDEPENDENT_AMBULATORY_CARE_PROVIDER_SITE_OTHER): Payer: BC Managed Care – PPO | Admitting: Gastroenterology

## 2022-03-05 ENCOUNTER — Encounter: Payer: Self-pay | Admitting: Gastroenterology

## 2022-03-05 VITALS — BP 133/78 | HR 63 | Temp 97.8°F | Ht 72.0 in | Wt 214.4 lb

## 2022-03-05 DIAGNOSIS — K5900 Constipation, unspecified: Secondary | ICD-10-CM

## 2022-03-05 DIAGNOSIS — R1011 Right upper quadrant pain: Secondary | ICD-10-CM | POA: Diagnosis not present

## 2022-03-05 DIAGNOSIS — K219 Gastro-esophageal reflux disease without esophagitis: Secondary | ICD-10-CM | POA: Diagnosis not present

## 2022-03-05 NOTE — Progress Notes (Signed)
**Note Gary-Identified via Obfuscation** GI Office Note    Referring Provider: Celene Squibb, MD Primary Care Physician:  Celene Squibb, MD Primary Gastroenterologist: Elon Alas. Abbey Chatters, DO  Date:  03/05/2022  ID:  Gary Woods, DOB [birth year], MRN [id number]   Chief Complaint   Chief Complaint  Patient presents with   Follow-up    Still having bowel issues.      History of Present Illness  Gary Woods is a 52 y.o. male with a history of anxiety/depression, COPD, RA, GERD, adenomatous colon plyps due for surveillance in 2028, chronic constipation presenting today with complaint of ongoing bowel issues.   Colonoscopy 02/14/21: -Nonbleeding internal hemorrhoid -5 mm polyp in descending colon (tubular adenoma) -Repeat colonoscopy in 5 years for surveillance  Last office visit 11/02/21. RUQ pain for couple months that is nagging. Triggered by fatty meals. Admitted to weekly NSAID use for long time. Advised to increase PPI to BID. Due to RUQ pain and GERD EGD was recommended. Chronic constipation with irregular Bms, lower abdominal pain and bloating. Advised to start miralax daily and if no improvement consider Linzess. Possible IBS component. Advised supportive measures with sitz baths and tucks pads and otc hemorrhoid creamf for rectal burning.   EGD 11/17/21: -gastritis s/p biopsy (normal mucosa, no H. Pylori) -normal duodenum -avoid NSAIDs -PPI BID -Start Linzess 145 mg daily.   Today:  Stopped NSAIDs and RUQ is better. GERD is controlled. No symptoms if he skips an evening dose.   Linzess is not affordable. Linzess does not work - made him feel weird and did not help. Incomplete emptying. BM may skip a day. Still straining but not all the time. Current not taking much of anything. Has taken Linzess a few times and other times just an otc laxative. Sometimes he is regular. Think Linzess may make his stomach feel worse if he took 2 pills. Reports no good results with miralax in the past on a regular basis. Last  BM this morning. Will sit several minutes on the commode 5-10 minutes at a time. No issues with hemorrhoid. No hematochezia, melena, N/V.    Current Outpatient Medications  Medication Sig Dispense Refill   Adalimumab (HUMIRA) 40 MG/0.8ML PSKT Inject 40 mg into the muscle every Saturday. week     ALPRAZolam (XANAX) 1 MG tablet Take 1 mg by mouth 2 (two) times daily as needed for anxiety.  4   atorvastatin (LIPITOR) 20 MG tablet Take 20 mg by mouth daily.     linaclotide (LINZESS) 145 MCG CAPS capsule Take 1 capsule (145 mcg total) by mouth daily before breakfast. 90 capsule 3   pantoprazole (PROTONIX) 40 MG tablet Take 1 tablet (40 mg total) by mouth 2 (two) times daily before a meal. 60 tablet 3   PARoxetine (PAXIL) 20 MG tablet Take 20 mg by mouth daily.     cyclobenzaprine (FLEXERIL) 10 MG tablet Take 1 tablet (10 mg total) by mouth at bedtime. (Patient not taking: Reported on 11/02/2021) 15 tablet 0   predniSONE (DELTASONE) 10 MG tablet Take 10 mg by mouth daily with breakfast. taper (Patient not taking: Reported on 03/05/2022)     No current facility-administered medications for this visit.    Past Medical History:  Diagnosis Date   Anxiety    Collagen vascular disease (HCC)    COPD (chronic obstructive pulmonary disease) (HCC)    Depression    GERD (gastroesophageal reflux disease)    Rheumatoid arthritis ([place])     Past Surgical History:  Procedure Laterality Date   BACK SURGERY     BIOPSY  11/17/2021   Procedure: BIOPSY;  Surgeon: Eloise Harman, DO;  Location: [facility];  Service: Endoscopy;;   COLONOSCOPY WITH PROPOFOL N/A 02/14/2021   Surgeon: Hurshel Keys K, DO;  nonbleeding internal hemorrhoids, one 5 mm tubular adenoma.  Recommended 5-year surveillance.   ESOPHAGOGASTRODUODENOSCOPY (EGD) WITH PROPOFOL N/A 11/17/2021   Procedure: ESOPHAGOGASTRODUODENOSCOPY (EGD) WITH PROPOFOL;  Surgeon: Eloise Harman, DO;  Location: [facility];  Service: Endoscopy;   Laterality: N/A;  12:15pm, asa 2   POLYPECTOMY  02/14/2021   Procedure: POLYPECTOMY;  Surgeon: Eloise Harman, DO;  Location: [facility];  Service: Endoscopy;;    Family History  Problem Relation Age of Onset   CAD Neg Hx     Allergies as of 03/05/2022   (No Known Allergies)    Social History   Socioeconomic History   Marital status: Divorced    Spouse name: Not on file   Number of children: Not on file   Years of education: Not on file   Highest education level: Not on file  Occupational History   Occupation: Architect  Tobacco Use   Smoking status: Every Day    Packs/day: 1.00    Years: 25.00    Total pack years: 25.00    Types: Cigarettes   Smokeless tobacco: Never  Vaping Use   Vaping Use: Never used  Substance and Sexual Activity   Alcohol use: No   Drug use: No   Sexual activity: Yes  Other Topics Concern   Not on file  Social History Narrative   Not on file   Social Determinants of Health   Financial Resource Strain: Not on file  Food Insecurity: Not on file  Transportation Needs: Not on file  Physical Activity: Not on file  Stress: Not on file  Social Connections: Not on file     Review of Systems   Gen: Denies fever, chills, anorexia. Denies fatigue, weakness, weight loss.  CV: Denies chest pain, palpitations, syncope, peripheral edema, and claudication. Resp: Denies dyspnea at rest, cough, wheezing, coughing up blood, and pleurisy. GI: See HPI Derm: Denies rash, itching, dry skin Psych: Denies depression, anxiety, memory loss, confusion. No homicidal or suicidal ideation.  Heme: Denies bruising, bleeding, and enlarged lymph nodes.   Physical Exam   BP 133/78 (BP Location: Right Arm, Patient Position: Sitting, Cuff Size: Normal)   Pulse 63   Temp 97.8 F (36.6 C) (Temporal)   Ht 6' (1.829 m)   Wt 214 lb 6.4 oz (97.3 kg)   SpO2 97%   BMI 29.08 kg/m   General:   Alert and oriented. No distress noted. Pleasant and cooperative.   Head:  Normocephalic and atraumatic. Eyes:  Conjuctiva clear without scleral icterus. Lungs:  Clear to auscultation bilaterally. No wheezes, rales, or rhonchi. No distress.  Heart:  S1, S2 present without murmurs appreciated.  Abdomen:  +BS, soft, non-tender and non-distended. No rebound or guarding. No HSM or masses noted. Rectal: deferred Msk:  Symmetrical without gross deformities. Normal posture. Extremities:  Without edema. Neurologic:  Alert and  oriented x4 Psych:  Alert and cooperative. Normal mood and affect.   Assessment  Gary Woods is a 52 y.o. male with a history of anxiety/depression, COPD, RA, GERD, adenomatous colon plyps due for surveillance in 2028, chronic constipation presenting today with complaint of ongoing bowel issues.   GERD, RUQ pain: EGD November 2023 with gastritis with normal  mucosa, negative for H. pylori.  Previously was taking frequent ibuprofen.  Pantoprazole was increased to twice daily at his last visit and after EGD was recommended to avoid NSAIDs.  Since then he has stopped taking ibuprofen normal or has any right upper quadrant pain.  Occasionally forgets to take second dose of PPI and does not have any breakthrough symptoms therefore advised him to decrease back to once daily dosing.  Constipation: Has a history of chronic constipation with irregular bowel movements associated with lower abdominal pain.  Admits to occasional straining and incomplete emptying.  Has tried Linzess which will help him have a bowel movement however states this at times makes him feel weird.  Has occasionally taken 2 tablets for total dose of 290 mcg and does have a bowel movement after this but does not like the way the medication makes him feel.  Given this I have provided some samples of Trulance for him to take 3 mg once daily and if effective we will consider sending in prescription and discontinue Linzess.  PLAN   Continue Linzess 145-290 mcg daily if able to  tolerate continue. Decrease to Pantoprazole 40 mg once daily.  GERD diet reinforced Continue to avoid NSAIDs Trial Trulance 3 mg once daily, if helpful and no side effects we will stop Linzess and order Trulance.  Samples provided today. Continue MiraLAX as needed. Follow up in 4 months, sooner if needed.     Venetia Night, MSN, FNP-BC, AGACNP-BC [facility]

## 2022-03-05 NOTE — Patient Instructions (Addendum)
Given your improvement in RUQ pain continue your pantoprazole but trial decreasing to 40 mg once daily 30 minutes prior to breakfast.  Continue to avoid NSAIDs. Follow a GERD diet:  Avoid fried, fatty, greasy, spicy, citrus foods. Avoid caffeine and carbonated beverages. Avoid chocolate. Try eating 4-6 small meals a day rather than 3 large meals. Do not eat within 3 hours of laying down. Prop head of bed up on wood or bricks to create a 6 inch incline.  You may continue Linzess 290 mcg once daily if you feel like you are able to tolerate this but if having weird feelings or more pain stop this.   I am giving you samples of Trulance. Take 1 tablet once daily. Do not take with Linzess. If this is helpful please call the office and we cna send in a prescription for you.  Plan to see yo in 4 months, sooner if needed.   It was a pleasure to see you today. I want to create trusting relationships with patients. If you receive a survey regarding your visit,  I greatly appreciate you taking time to fill this out on paper or through your MyChart. I value your feedback.  Venetia Night, MSN, FNP-BC, AGACNP-BC [facility]

## 2022-04-12 DIAGNOSIS — E782 Mixed hyperlipidemia: Secondary | ICD-10-CM | POA: Diagnosis not present

## 2022-04-12 DIAGNOSIS — R7301 Impaired fasting glucose: Secondary | ICD-10-CM | POA: Diagnosis not present

## 2022-04-18 ENCOUNTER — Other Ambulatory Visit (HOSPITAL_COMMUNITY): Payer: Self-pay | Admitting: Internal Medicine

## 2022-04-18 DIAGNOSIS — K5900 Constipation, unspecified: Secondary | ICD-10-CM | POA: Diagnosis not present

## 2022-04-18 DIAGNOSIS — R7301 Impaired fasting glucose: Secondary | ICD-10-CM | POA: Diagnosis not present

## 2022-04-18 DIAGNOSIS — J302 Other seasonal allergic rhinitis: Secondary | ICD-10-CM | POA: Diagnosis not present

## 2022-04-18 DIAGNOSIS — M79672 Pain in left foot: Secondary | ICD-10-CM | POA: Diagnosis not present

## 2022-04-18 DIAGNOSIS — K219 Gastro-esophageal reflux disease without esophagitis: Secondary | ICD-10-CM | POA: Diagnosis not present

## 2022-04-18 DIAGNOSIS — E663 Overweight: Secondary | ICD-10-CM | POA: Diagnosis not present

## 2022-04-18 DIAGNOSIS — M069 Rheumatoid arthritis, unspecified: Secondary | ICD-10-CM | POA: Diagnosis not present

## 2022-04-18 DIAGNOSIS — Z0001 Encounter for general adult medical examination with abnormal findings: Secondary | ICD-10-CM | POA: Diagnosis not present

## 2022-04-18 DIAGNOSIS — E782 Mixed hyperlipidemia: Secondary | ICD-10-CM | POA: Diagnosis not present

## 2022-05-10 DIAGNOSIS — M79672 Pain in left foot: Secondary | ICD-10-CM | POA: Diagnosis not present

## 2022-05-10 DIAGNOSIS — M722 Plantar fascial fibromatosis: Secondary | ICD-10-CM | POA: Diagnosis not present

## 2022-05-16 ENCOUNTER — Ambulatory Visit (HOSPITAL_COMMUNITY): Admission: RE | Admit: 2022-05-16 | Payer: BC Managed Care – PPO | Source: Ambulatory Visit

## 2022-05-16 ENCOUNTER — Encounter (HOSPITAL_COMMUNITY): Payer: Self-pay

## 2022-06-08 ENCOUNTER — Ambulatory Visit (HOSPITAL_COMMUNITY): Payer: BC Managed Care – PPO

## 2022-07-03 DIAGNOSIS — M79672 Pain in left foot: Secondary | ICD-10-CM | POA: Diagnosis not present

## 2022-07-03 DIAGNOSIS — M722 Plantar fascial fibromatosis: Secondary | ICD-10-CM | POA: Diagnosis not present

## 2022-07-04 DIAGNOSIS — Z79899 Other long term (current) drug therapy: Secondary | ICD-10-CM | POA: Diagnosis not present

## 2022-07-04 DIAGNOSIS — M0589 Other rheumatoid arthritis with rheumatoid factor of multiple sites: Secondary | ICD-10-CM | POA: Diagnosis not present

## 2022-07-04 DIAGNOSIS — M1991 Primary osteoarthritis, unspecified site: Secondary | ICD-10-CM | POA: Diagnosis not present

## 2022-07-18 ENCOUNTER — Ambulatory Visit (HOSPITAL_COMMUNITY)
Admission: RE | Admit: 2022-07-18 | Discharge: 2022-07-18 | Disposition: A | Discharge status: Home / Self Care | Payer: BC Managed Care – PPO | Source: Ambulatory Visit | Attending: Internal Medicine | Admitting: Internal Medicine

## 2022-07-18 DIAGNOSIS — E782 Mixed hyperlipidemia: Secondary | ICD-10-CM | POA: Insufficient documentation

## 2022-08-14 DIAGNOSIS — M79672 Pain in left foot: Secondary | ICD-10-CM | POA: Diagnosis not present

## 2022-08-14 DIAGNOSIS — M722 Plantar fascial fibromatosis: Secondary | ICD-10-CM | POA: Diagnosis not present

## 2022-08-16 ENCOUNTER — Other Ambulatory Visit: Payer: Self-pay | Admitting: Gastroenterology

## 2022-08-16 DIAGNOSIS — K219 Gastro-esophageal reflux disease without esophagitis: Secondary | ICD-10-CM

## 2022-08-16 DIAGNOSIS — R1011 Right upper quadrant pain: Secondary | ICD-10-CM

## 2022-09-27 DIAGNOSIS — J029 Acute pharyngitis, unspecified: Secondary | ICD-10-CM | POA: Diagnosis not present

## 2022-09-27 DIAGNOSIS — R059 Cough, unspecified: Secondary | ICD-10-CM | POA: Diagnosis not present

## 2022-09-27 DIAGNOSIS — J069 Acute upper respiratory infection, unspecified: Secondary | ICD-10-CM | POA: Diagnosis not present

## 2022-10-17 DIAGNOSIS — E782 Mixed hyperlipidemia: Secondary | ICD-10-CM | POA: Diagnosis not present

## 2022-10-17 DIAGNOSIS — Z125 Encounter for screening for malignant neoplasm of prostate: Secondary | ICD-10-CM | POA: Diagnosis not present

## 2022-10-17 DIAGNOSIS — R7301 Impaired fasting glucose: Secondary | ICD-10-CM | POA: Diagnosis not present

## 2022-11-16 ENCOUNTER — Other Ambulatory Visit (HOSPITAL_COMMUNITY): Payer: Self-pay | Admitting: Family Medicine

## 2022-11-16 DIAGNOSIS — E782 Mixed hyperlipidemia: Secondary | ICD-10-CM

## 2022-11-16 DIAGNOSIS — M159 Polyosteoarthritis, unspecified: Secondary | ICD-10-CM | POA: Diagnosis not present

## 2022-11-16 DIAGNOSIS — Z23 Encounter for immunization: Secondary | ICD-10-CM | POA: Diagnosis not present

## 2022-11-16 DIAGNOSIS — E663 Overweight: Secondary | ICD-10-CM | POA: Diagnosis not present

## 2022-11-16 DIAGNOSIS — M069 Rheumatoid arthritis, unspecified: Secondary | ICD-10-CM | POA: Diagnosis not present

## 2023-01-02 DIAGNOSIS — M0589 Other rheumatoid arthritis with rheumatoid factor of multiple sites: Secondary | ICD-10-CM | POA: Diagnosis not present

## 2023-01-02 DIAGNOSIS — Z79899 Other long term (current) drug therapy: Secondary | ICD-10-CM | POA: Diagnosis not present

## 2023-01-02 DIAGNOSIS — R5383 Other fatigue: Secondary | ICD-10-CM | POA: Diagnosis not present

## 2023-01-02 DIAGNOSIS — R945 Abnormal results of liver function studies: Secondary | ICD-10-CM | POA: Diagnosis not present

## 2023-01-02 DIAGNOSIS — M1991 Primary osteoarthritis, unspecified site: Secondary | ICD-10-CM | POA: Diagnosis not present

## 2023-01-10 DIAGNOSIS — U071 COVID-19: Secondary | ICD-10-CM | POA: Diagnosis not present

## 2023-01-21 DIAGNOSIS — L0232 Furuncle of buttock: Secondary | ICD-10-CM | POA: Diagnosis not present

## 2023-01-21 DIAGNOSIS — L0292 Furuncle, unspecified: Secondary | ICD-10-CM | POA: Diagnosis not present

## 2023-04-16 ENCOUNTER — Other Ambulatory Visit: Payer: Self-pay | Admitting: Gastroenterology

## 2023-04-16 DIAGNOSIS — K219 Gastro-esophageal reflux disease without esophagitis: Secondary | ICD-10-CM

## 2023-04-16 DIAGNOSIS — R1011 Right upper quadrant pain: Secondary | ICD-10-CM

## 2023-04-16 NOTE — Telephone Encounter (Signed)
 Needs OV for further refills

## 2023-04-19 DIAGNOSIS — S161XXA Strain of muscle, fascia and tendon at neck level, initial encounter: Secondary | ICD-10-CM | POA: Diagnosis not present

## 2023-04-19 DIAGNOSIS — Z733 Stress, not elsewhere classified: Secondary | ICD-10-CM | POA: Diagnosis not present

## 2023-04-19 DIAGNOSIS — M069 Rheumatoid arthritis, unspecified: Secondary | ICD-10-CM | POA: Diagnosis not present

## 2023-04-19 DIAGNOSIS — F439 Reaction to severe stress, unspecified: Secondary | ICD-10-CM | POA: Diagnosis not present

## 2023-04-19 DIAGNOSIS — T148XXA Other injury of unspecified body region, initial encounter: Secondary | ICD-10-CM | POA: Diagnosis not present

## 2023-04-22 NOTE — Telephone Encounter (Signed)
 He thought that Dr. Scharlene Gloss office refilled this so he is going to check before scheduling an appt.

## 2023-05-15 DIAGNOSIS — R7303 Prediabetes: Secondary | ICD-10-CM | POA: Diagnosis not present

## 2023-05-21 DIAGNOSIS — I700 Atherosclerosis of aorta: Secondary | ICD-10-CM | POA: Diagnosis not present

## 2023-05-21 DIAGNOSIS — R7301 Impaired fasting glucose: Secondary | ICD-10-CM | POA: Diagnosis not present

## 2023-05-21 DIAGNOSIS — M069 Rheumatoid arthritis, unspecified: Secondary | ICD-10-CM | POA: Diagnosis not present

## 2023-05-21 DIAGNOSIS — Z23 Encounter for immunization: Secondary | ICD-10-CM | POA: Diagnosis not present

## 2023-05-21 DIAGNOSIS — E782 Mixed hyperlipidemia: Secondary | ICD-10-CM | POA: Diagnosis not present

## 2023-05-21 DIAGNOSIS — Z125 Encounter for screening for malignant neoplasm of prostate: Secondary | ICD-10-CM | POA: Diagnosis not present

## 2023-05-21 DIAGNOSIS — Z733 Stress, not elsewhere classified: Secondary | ICD-10-CM | POA: Diagnosis not present

## 2023-05-21 DIAGNOSIS — Z0000 Encounter for general adult medical examination without abnormal findings: Secondary | ICD-10-CM | POA: Diagnosis not present

## 2023-06-14 ENCOUNTER — Other Ambulatory Visit: Payer: Self-pay | Admitting: Gastroenterology

## 2023-06-14 DIAGNOSIS — K219 Gastro-esophageal reflux disease without esophagitis: Secondary | ICD-10-CM

## 2023-06-14 DIAGNOSIS — R1011 Right upper quadrant pain: Secondary | ICD-10-CM

## 2023-07-03 DIAGNOSIS — Z79899 Other long term (current) drug therapy: Secondary | ICD-10-CM | POA: Diagnosis not present

## 2023-07-03 DIAGNOSIS — M0589 Other rheumatoid arthritis with rheumatoid factor of multiple sites: Secondary | ICD-10-CM | POA: Diagnosis not present

## 2023-07-03 DIAGNOSIS — M1991 Primary osteoarthritis, unspecified site: Secondary | ICD-10-CM | POA: Diagnosis not present

## 2023-07-25 DIAGNOSIS — H6122 Impacted cerumen, left ear: Secondary | ICD-10-CM | POA: Diagnosis not present

## 2023-08-21 DIAGNOSIS — L720 Epidermal cyst: Secondary | ICD-10-CM | POA: Diagnosis not present

## 2023-11-15 DIAGNOSIS — E782 Mixed hyperlipidemia: Secondary | ICD-10-CM | POA: Diagnosis not present

## 2023-11-15 DIAGNOSIS — Z125 Encounter for screening for malignant neoplasm of prostate: Secondary | ICD-10-CM | POA: Diagnosis not present

## 2023-11-15 DIAGNOSIS — R7301 Impaired fasting glucose: Secondary | ICD-10-CM | POA: Diagnosis not present

## 2023-11-21 DIAGNOSIS — R7301 Impaired fasting glucose: Secondary | ICD-10-CM | POA: Diagnosis not present

## 2023-11-21 DIAGNOSIS — E782 Mixed hyperlipidemia: Secondary | ICD-10-CM | POA: Diagnosis not present

## 2023-11-21 DIAGNOSIS — M069 Rheumatoid arthritis, unspecified: Secondary | ICD-10-CM | POA: Diagnosis not present

## 2023-11-21 DIAGNOSIS — I700 Atherosclerosis of aorta: Secondary | ICD-10-CM | POA: Diagnosis not present

## 2024-01-01 DIAGNOSIS — Z79899 Other long term (current) drug therapy: Secondary | ICD-10-CM | POA: Diagnosis not present

## 2024-01-01 DIAGNOSIS — M0589 Other rheumatoid arthritis with rheumatoid factor of multiple sites: Secondary | ICD-10-CM | POA: Diagnosis not present

## 2024-01-01 DIAGNOSIS — R5383 Other fatigue: Secondary | ICD-10-CM | POA: Diagnosis not present

## 2024-01-01 DIAGNOSIS — M1991 Primary osteoarthritis, unspecified site: Secondary | ICD-10-CM | POA: Diagnosis not present
